# Patient Record
Sex: Male | Born: 1990 | Race: Black or African American | Hispanic: No | Marital: Single | Smoking: Current every day smoker
Health system: Southern US, Community
[De-identification: ages and names within clinical notes are randomized; demographics above are authoritative.]

## PROBLEM LIST (undated history)

## (undated) DIAGNOSIS — I10 Essential (primary) hypertension: Secondary | ICD-10-CM

## (undated) DIAGNOSIS — A539 Syphilis, unspecified: Secondary | ICD-10-CM

## (undated) DIAGNOSIS — B009 Herpesviral infection, unspecified: Secondary | ICD-10-CM

## (undated) DIAGNOSIS — A749 Chlamydial infection, unspecified: Secondary | ICD-10-CM

## (undated) HISTORY — DX: Essential (primary) hypertension: I10

## (undated) HISTORY — DX: Herpesviral infection, unspecified: B00.9

## (undated) HISTORY — DX: Syphilis, unspecified: A53.9

## (undated) HISTORY — DX: Chlamydial infection, unspecified: A74.9

---

## 2014-10-13 ENCOUNTER — Encounter (HOSPITAL_COMMUNITY): Payer: Self-pay | Admitting: Emergency Medicine

## 2014-10-13 ENCOUNTER — Emergency Department (HOSPITAL_COMMUNITY)
Admission: EM | Admit: 2014-10-13 | Discharge: 2014-10-13 | Disposition: A | Discharge status: Home / Self Care | Payer: Medicaid Other | Attending: Emergency Medicine | Admitting: Emergency Medicine

## 2014-10-13 DIAGNOSIS — T23102A Burn of first degree of left hand, unspecified site, initial encounter: Secondary | ICD-10-CM

## 2014-10-13 DIAGNOSIS — T23202A Burn of second degree of left hand, unspecified site, initial encounter: Secondary | ICD-10-CM | POA: Insufficient documentation

## 2014-10-13 DIAGNOSIS — Y9289 Other specified places as the place of occurrence of the external cause: Secondary | ICD-10-CM | POA: Insufficient documentation

## 2014-10-13 DIAGNOSIS — Z720 Tobacco use: Secondary | ICD-10-CM | POA: Diagnosis not present

## 2014-10-13 DIAGNOSIS — T23002A Burn of unspecified degree of left hand, unspecified site, initial encounter: Secondary | ICD-10-CM | POA: Diagnosis present

## 2014-10-13 DIAGNOSIS — Y93G9 Activity, other involving cooking and grilling: Secondary | ICD-10-CM | POA: Insufficient documentation

## 2014-10-13 DIAGNOSIS — X102XXA Contact with fats and cooking oils, initial encounter: Secondary | ICD-10-CM | POA: Diagnosis not present

## 2014-10-13 MED ORDER — HYDROCODONE-ACETAMINOPHEN 5-325 MG PO TABS: 1.0000 | ORAL_TABLET | ORAL | Status: DC | PRN

## 2014-10-13 MED ORDER — HYDROCODONE-ACETAMINOPHEN 5-325 MG PO TABS
1.0000 | ORAL_TABLET | Freq: Once | ORAL | Status: AC
  Administered 2014-10-13: 1 via ORAL
  Filled 2014-10-13: qty 1

## 2014-10-13 MED ORDER — SILVER SULFADIAZINE 1 % EX CREA
TOPICAL_CREAM | Freq: Once | CUTANEOUS | Status: AC
  Administered 2014-10-13: 1 via TOPICAL
  Filled 2014-10-13: qty 50
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
## 2014-10-13 NOTE — ED Notes (Signed)
Pt alert & oriented x4, stable gait. Patient given discharge instructions, paperwork & prescription(s). Patient  instructed to stop at the registration desk to finish any additional paperwork. Patient verbalized understanding. Pt left department w/ no further questions. 

## 2014-10-13 NOTE — Discharge Instructions (Signed)
Do not drive while taking the narcotic as it will make you sleepy. Return tomorrow for recheck and dressing change
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
## 2014-10-14 ENCOUNTER — Emergency Department (HOSPITAL_COMMUNITY)
Admission: EM | Admit: 2014-10-14 | Discharge: 2014-10-14 | Disposition: A | Discharge status: Home / Self Care | Payer: Medicaid Other | Attending: Emergency Medicine | Admitting: Emergency Medicine

## 2014-10-14 ENCOUNTER — Encounter (HOSPITAL_COMMUNITY): Payer: Self-pay | Admitting: Emergency Medicine

## 2014-10-14 DIAGNOSIS — Z720 Tobacco use: Secondary | ICD-10-CM | POA: Diagnosis not present

## 2014-10-14 DIAGNOSIS — Z5189 Encounter for other specified aftercare: Secondary | ICD-10-CM

## 2014-10-14 DIAGNOSIS — Z4800 Encounter for change or removal of nonsurgical wound dressing: Secondary | ICD-10-CM | POA: Insufficient documentation

## 2014-10-14 MED ORDER — BACITRACIN ZINC 500 UNIT/GM EX OINT
TOPICAL_OINTMENT | CUTANEOUS | Status: AC
  Administered 2014-10-14: 1 via TOPICAL
  Filled 2014-10-14: qty 0.9
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
## 2014-10-14 NOTE — Discharge Instructions (Signed)
Burn Care Your skin is a natural barrier to infection. It is the largest organ of your body. Burns damage this natural protection. To help prevent infection, it is very important to follow your caregiver's instructions in the care of your burn. Burns are classified as:  First degree. There is only redness of the skin (erythema). No scarring is expected.  Second degree. There is blistering of the skin. Scarring may occur with deeper burns.  Third degree. All layers of the skin are injured, and scarring is expected. HOME CARE INSTRUCTIONS   Wash your hands well before changing your bandage.  Change your bandage as often as directed by your caregiver.  Remove the old bandage. If the bandage sticks, you may soak it off with cool, clean water.  Cleanse the burn thoroughly but gently with mild soap and water.  Pat the area dry with a clean, dry cloth.  Apply a thin layer of antibacterial cream to the burn.  Apply a clean bandage as instructed by your caregiver.  Keep the bandage as clean and dry as possible.  Elevate the affected area for the first 24 hours, then as instructed by your caregiver.  Only take over-the-counter or prescription medicines for pain, discomfort, or fever as directed by your caregiver. SEEK IMMEDIATE MEDICAL CARE IF:   You develop excessive pain.  You develop redness, tenderness, swelling, or red streaks near the burn.  The burned area develops yellowish-white fluid (pus) or a bad smell.  You have a fever. MAKE SURE YOU:   Understand these instructions.  Will watch your condition.  Will get help right away if you are not doing well or get worse. Document Released: 12/12/2005 Document Revised: 03/05/2012 Document Reviewed: 05/04/2011 ExitCare Patient Information 2015 [employer]. This information is not intended to replace advice given to you by your health care provider. Make sure you discuss any questions you have with your health care  provider.  
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
## 2014-10-14 NOTE — ED Provider Notes (Signed)
Medical screening examination/treatment/procedure(s) were performed by non-physician practitioner and as supervising physician I was immediately available for consultation/collaboration.   EKG Interpretation None       Juliet RudeNathan R. Rubin PayorPickering, MD 10/14/14 [phone]

## 2014-10-16 NOTE — ED Provider Notes (Signed)
Medical screening examination/treatment/procedure(s) were performed by non-physician practitioner and as supervising physician I was immediately available for consultation/collaboration.     Brynnan Rodenbaugh, MD 10/16/14 0708 

## 2014-11-04 ENCOUNTER — Encounter (HOSPITAL_COMMUNITY): Payer: Self-pay | Admitting: Emergency Medicine

## 2014-11-04 ENCOUNTER — Emergency Department (HOSPITAL_COMMUNITY)
Admission: EM | Admit: 2014-11-04 | Discharge: 2014-11-04 | Disposition: A | Discharge status: Home / Self Care | Payer: Medicaid Other | Attending: Emergency Medicine | Admitting: Emergency Medicine

## 2014-11-04 DIAGNOSIS — Z720 Tobacco use: Secondary | ICD-10-CM | POA: Insufficient documentation

## 2014-11-04 DIAGNOSIS — R21 Rash and other nonspecific skin eruption: Secondary | ICD-10-CM | POA: Insufficient documentation

## 2014-11-04 MED ORDER — PREDNISONE 20 MG PO TABS: 40.0000 mg | ORAL_TABLET | Freq: Every day | ORAL | Status: DC

## 2014-11-04 MED ORDER — PREDNISONE 20 MG PO TABS
40.0000 mg | ORAL_TABLET | Freq: Once | ORAL | Status: AC
  Administered 2014-11-04: 40 mg via ORAL
  Filled 2014-11-04: qty 2
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
## 2014-11-04 NOTE — Discharge Instructions (Signed)
Benadryl every 6 hours as needed Prednisone once daily for 5 days  Please call your doctor for a followup appointment within 24-48 hours. When you talk to your doctor please let them know that you were seen in the emergency department and have them acquire all of your records so that they can discuss the findings with you and formulate a treatment plan to fully care for your new and ongoing problems.  You have been diagnosed with an allergic reaction. Usually allergic reactions like this are caused by exposures to something that you either ate or touched or smelled.  It may be related to a number of different exposures including a new perfume, topical creams, soaps, detergents, linens, clothing, medications. Occasionally we do not find an answer for why there is an allergic reaction. These are treated the same way including Benadryl as needed for itching and rash. (This can be used up to 50 mg every 6 hours as needed).  Pepcid 20 mg every night and prednisone once a day for 5 days. Please do not take the Benadryl and drive or take care of children or other imported duties as the Benadryl can make you sleepy.    If you should develop severe or worsening symptoms including difficulty breathing, difficulty swallowing, wheezing or increased coughing or a rash that developed on the inside of your mouth or a worsening rash on your skin, return to the hospital immediately for a recheck. Please call your Dr. in the morning for a recheck in 2 days if you are still having symptoms. If you do not have a Dr. see the list below.  If we have identified the source of your allergic reaction, please avoid this at all costs. This means stopping the medication if it is a new medication or a voiding topical exposures such as creams lotions body soaps or deodorants if this is the source.  Allergic Reaction, Mild to Moderate Allergies may happen from anything your body is sensitive to. This may be food, medications, pollens,  chemicals, and nearly anything around you in everyday life that produces allergens. An allergen is anything that causes an allergy producing substance. Allergens cause your body to release allergic antibodies. Through a chain of events, they cause a release of histamine into the blood stream. Histamines are meant to protect you, but they also cause your discomfort. This is why antihistamines are often used for allergies. Heredity is often a factor in causing allergic reactions. This means you may have some of the same allergies as your parents. Allergies happen in all age groups. You may have some idea of what caused your reaction. There are many allergens around us. It may be difficult to know what caused your reaction. If this is a first time event, it may never happen again. Allergies cannot be cured but can be controlled with medications. SYMPTOMS  You may get some or all of the following problems from allergies.  Swelling and itching in and around the mouth.   Tearing, itchy eyes.   Nasal congestion and runny nose.   Sneezing and coughing.   An itchy red rash or hives.   Vomiting or diarrhea.   Difficulty breathing.  Seasonal allergies occur in all age groups. They are seasonal because they usually occur during the same season every year. They may be a reaction to molds, grass pollens, or tree pollens. Other causes of allergies are house dust mite allergens, pet dander and mold spores. These are just a common few of  the thousands of allergens around us. All of the symptoms listed above happen when you come in contact with pollens and other allergens. Seasonal allergies are usually not life threatening. They are generally more of a nuisance that can often be handled using medications. Hay fever is a combination of all or some of the above listed allergy problems. It may often be treated with simple over-the-counter medications such as diphenhydramine. Take medication as directed. Check with  your caregiver or package insert for child dosages. TREATMENT AND HOME CARE INSTRUCTIONS If hives or rash are present:  Take medications as directed.   You may use an over-the-counter antihistamine (diphenhydramine) for hives and itching as needed. Do not drive or drink alcohol until medications used to treat the reaction have worn off. Antihistamines tend to make people sleepy.   Apply cold cloths (compresses) to the skin or take baths in cool water. This will help itching. Avoid hot baths or showers. Heat will make a rash and itching worse.   If your allergies persist and become more severe, and over the counter medications are not effective, there are many new medications your caretaker can prescribe. Immunotherapy or desensitizing injections can be used if all else fails. Follow up with your caregiver if problems continue.  SEEK MEDICAL CARE IF:   Your allergies are becoming progressively more troublesome.   You suspect a food allergy. Symptoms generally happen within 30 minutes of eating a food.   Your symptoms have not gone away within 2 days or are getting worse.   You develop new symptoms.   You want to retest yourself or your child with a food or drink you think causes an allergic reaction. Never test yourself or your child of a suspected allergy without being under the watchful eye of your caregivers. A second exposure to an allergen may be life-threatening.  SEEK IMMEDIATE MEDICAL CARE IF:  You develop difficulty breathing or wheezing, or have a tight feeling in your chest or throat.   You develop a swollen mouth, hives, swelling, or itching all over your body.  A severe reaction with any of the above problems should be considered life-threatening. If you suddenly develop difficulty breathing call for local emergency medical help. THIS IS AN EMERGENCY. MAKE SURE YOU:   Understand these instructions.   Will watch your condition.   Will get help right away if you are not  doing well or get worse.  Document Released: 10/09/2007 Document Revised: 12/01/2011 Document Reviewed: 10/09/2007 [facility] Patient Information 2012 [place], [place].

## 2016-01-19 ENCOUNTER — Ambulatory Visit: Payer: Self-pay | Admitting: Family Medicine

## 2016-01-28 ENCOUNTER — Ambulatory Visit: Payer: Self-pay | Admitting: Family Medicine

## 2016-01-29 ENCOUNTER — Encounter: Payer: Self-pay | Admitting: Family Medicine

## 2016-02-02 ENCOUNTER — Ambulatory Visit: Payer: Self-pay | Admitting: Family Medicine

## 2016-02-04 ENCOUNTER — Ambulatory Visit: Payer: Self-pay | Admitting: Family Medicine

## 2016-02-09 ENCOUNTER — Encounter: Payer: Self-pay | Admitting: Family Medicine

## 2016-02-09 ENCOUNTER — Ambulatory Visit (INDEPENDENT_AMBULATORY_CARE_PROVIDER_SITE_OTHER): Payer: Medicaid Other | Admitting: Family Medicine

## 2016-02-09 VITALS — BP 136/83 | HR 75 | Temp 97.0°F | Ht 70.0 in | Wt 260.8 lb

## 2016-02-09 DIAGNOSIS — M545 Low back pain, unspecified: Secondary | ICD-10-CM

## 2016-02-09 DIAGNOSIS — Z7251 High risk heterosexual behavior: Secondary | ICD-10-CM | POA: Diagnosis not present

## 2016-02-09 DIAGNOSIS — E669 Obesity, unspecified: Secondary | ICD-10-CM

## 2016-02-09 DIAGNOSIS — Z23 Encounter for immunization: Secondary | ICD-10-CM

## 2016-02-09 NOTE — Patient Instructions (Signed)
Great to meet you!  Come back in 1 year unless you need [place] sooner, you are always welcome to come when you need to, just call for an appointment  We will call with labs within 1 week
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
## 2016-02-11 LAB — LIPID PANEL
Chol/HDL Ratio: 4 ratio units (ref 0.0–5.0)
Cholesterol, Total: 173 mg/dL (ref 100–199)
HDL: 43 mg/dL (ref >39)
LDL Calculated: 100 mg/dL — ABNORMAL HIGH (ref 0–99)
Triglycerides: 148 mg/dL (ref 0–149)
VLDL Cholesterol Cal: 30 mg/dL (ref 5–40)

## 2016-02-11 LAB — CMP14+EGFR
ALT: 37 IU/L (ref 0–44)
AST: 23 IU/L (ref 0–40)
Albumin/Globulin Ratio: 1.6 (ref 1.1–2.5)
Albumin: 4.6 g/dL (ref 3.5–5.5)
Alkaline Phosphatase: 87 IU/L (ref 39–117)
BILIRUBIN TOTAL: 0.3 mg/dL (ref 0.0–1.2)
BUN/Creatinine Ratio: 12 (ref 8–19)
BUN: 9 mg/dL (ref 6–20)
CALCIUM: 9.9 mg/dL (ref 8.7–10.2)
CHLORIDE: 100 mmol/L (ref 96–106)
CO2: 27 mmol/L (ref 18–29)
Creatinine, Ser: 0.78 mg/dL (ref 0.76–1.27)
GFR calc non Af Amer: 127 mL/min/{1.73_m2} (ref >59)
GFR, EST AFRICAN AMERICAN: 147 mL/min/{1.73_m2} (ref >59)
Globulin, Total: 2.9 g/dL (ref 1.5–4.5)
Glucose: 89 mg/dL (ref 65–99)
Potassium: 5.1 mmol/L (ref 3.5–5.2)
Sodium: 141 mmol/L (ref 134–144)
TOTAL PROTEIN: 7.5 g/dL (ref 6.0–8.5)

## 2016-02-11 LAB — GC/CHLAMYDIA PROBE AMP
Chlamydia trachomatis, NAA: NEGATIVE
Neisseria gonorrhoeae by PCR: NEGATIVE

## 2016-02-11 LAB — RPR: RPR: REACTIVE — AB

## 2016-02-11 LAB — RPR, QUANT+TP ABS (REFLEX): T Pallidum Abs: POSITIVE — AB

## 2016-02-11 LAB — HIV ANTIBODY (ROUTINE TESTING W REFLEX): HIV SCREEN 4TH GENERATION: NONREACTIVE

## 2016-02-12 ENCOUNTER — Telehealth: Payer: Self-pay | Admitting: Family Medicine

## 2016-02-12 NOTE — Telephone Encounter (Signed)
Called and discussed  I need his old RPR ratio to confirm adequate treatment and rule out re-infection. He will go to the health department and ask for records to be sent.   Murtis Sink, MD [facility] 02/12/2016, 2:49 PM

## 2016-02-12 NOTE — Telephone Encounter (Signed)
-----   Message from Elenora Gamma, MD sent at 02/12/2016  1:45 PM EST ----- Will you call about his labs? His cholesterol, kidney function, and liver function look good His HIV is negative His syphilis test is positive but should be since he had it and was treated, The ratio 1:4 is probably consitent with a treated infection, not a new infection but we will need health department records to be sure. Can we sk him to sign a ROI or go by the HD to get it sent? Thanks! Sam

## 2016-02-16 ENCOUNTER — Telehealth: Payer: Self-pay | Admitting: Family Medicine

## 2016-02-17 NOTE — Telephone Encounter (Signed)
Called pt to let him know we have not received records. He said he signed records release at the health dept a few days ago. I told him we would call when his records come in/lc

## 2016-02-29 ENCOUNTER — Telehealth: Payer: Self-pay | Admitting: Family Medicine

## 2016-02-29 NOTE — Telephone Encounter (Signed)
Got medical records for patient

## 2016-03-18 ENCOUNTER — Telehealth: Payer: Self-pay | Admitting: Family Medicine

## 2016-03-23 NOTE — Telephone Encounter (Signed)
Called patient to let him know that we haven't [place] [facility]. Release was sent 03-09-16.

## 2016-03-29 ENCOUNTER — Ambulatory Visit: Payer: Medicaid Other | Admitting: Family Medicine

## 2016-03-29 ENCOUNTER — Ambulatory Visit (INDEPENDENT_AMBULATORY_CARE_PROVIDER_SITE_OTHER): Payer: Medicaid Other | Admitting: Family Medicine

## 2016-03-29 ENCOUNTER — Encounter: Payer: Self-pay | Admitting: Family Medicine

## 2016-03-29 VITALS — BP 119/74 | HR 81 | Temp 97.6°F | Ht 70.0 in | Wt 257.4 lb

## 2016-03-29 DIAGNOSIS — R590 Localized enlarged lymph nodes: Secondary | ICD-10-CM | POA: Diagnosis not present

## 2016-03-29 DIAGNOSIS — Z8619 Personal history of other infectious and parasitic diseases: Secondary | ICD-10-CM | POA: Diagnosis not present

## 2016-03-29 NOTE — Patient Instructions (Signed)
Great to see you!  I think your bump is a lymph node and it should go away within a few weeks. If you develop any redness, warmth, or pain at the site or of your ear please get evaluated again.
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
## 2016-07-18 ENCOUNTER — Encounter: Payer: Self-pay | Admitting: Family Medicine

## 2016-08-05 ENCOUNTER — Encounter: Payer: Self-pay | Admitting: Family Medicine

## 2016-08-05 ENCOUNTER — Ambulatory Visit (INDEPENDENT_AMBULATORY_CARE_PROVIDER_SITE_OTHER): Payer: Medicaid Other | Admitting: Family Medicine

## 2016-08-05 VITALS — BP 124/81 | HR 64 | Temp 97.5°F | Ht 70.0 in | Wt 256.8 lb

## 2016-08-05 DIAGNOSIS — R229 Localized swelling, mass and lump, unspecified: Secondary | ICD-10-CM

## 2016-08-05 DIAGNOSIS — Z7251 High risk heterosexual behavior: Secondary | ICD-10-CM | POA: Diagnosis not present

## 2016-08-05 DIAGNOSIS — Z8619 Personal history of other infectious and parasitic diseases: Secondary | ICD-10-CM

## 2016-08-05 DIAGNOSIS — Z720 Tobacco use: Secondary | ICD-10-CM | POA: Diagnosis not present

## 2016-08-05 NOTE — Progress Notes (Signed)
   HPI  Patient presents today here with concern for STDs and to discuss a skin nodule.  Patient has had 804 male sexual partners in the last 6 months., he denies any direct concerns of STIs. He has no pelvic discomfort or penile discharge. No fevers, chills, sweats, and he feels well.   He uses condoms with anal sex but not with oral sex.   R eyelid skin nodule which developed after removing an old eyebrpow piercing.  No pain, changing, or discharge. Would like referral to discuss if it can be removed.   PMH: Smoking status noted ROS: Per HPI  Objective: BP 124/81   Pulse 64   Temp 97.5 F (36.4 C) (Oral)   Ht 5\' 10"  (1.778 m)   Wt 256 lb 12.8 oz (116.5 kg)   BMI 36.85 kg/m  Gen: NAD, alert, cooperative with exam HEENT: NCAT, R eye brow with approx 5 mm circular skin nodule, non tender to palp and mobile CV: RRR, good S1/S2, no murmur Resp: CTABL, no wheezes, non-labored Ext: No edema, warm Neuro: Alert and oriented, No gross deficits  Declines GU exam  Assessment and plan:  # High risk sexual behavior Also with history of syphilis. HIV, RPR, and urine Gonorrhea and chlamydia checked today. Discussed always using condoms.  Last RPR was 1:4, down from 1:64 at the HD   # Skin nodule Refer to derm, Likely scar tissue/fibrosis.   # tobacco abuse Recommended quitting smoking completely, he is not ready to quit     Orders Placed This Encounter  Procedures  . GC/Chlamydia Probe Amp  . RPR  . HIV antibody  . Ambulatory referral to Dermatology    Referral Priority:   Routine    Referral Type:   Consultation    Referral Reason:   Specialty Services Required    Requested Specialty:   Dermatology    Number of Visits Requested:   1     Murtis SinkSam Reeshemah Nazaryan, MD [facility] 08/05/2016, 1:12 PM

## 2016-08-05 NOTE — Patient Instructions (Signed)
Great to meet you!  Lets see you again in 1 year unless you need us sooner.   We will send your labs on mychart or call within 1 week  You will hear from us about a dermatology referral.

## 2016-08-08 LAB — GC/CHLAMYDIA PROBE AMP
CHLAMYDIA, DNA PROBE: NEGATIVE
NEISSERIA GONORRHOEAE BY PCR: NEGATIVE

## 2016-08-08 LAB — RPR: RPR: REACTIVE — AB

## 2016-08-08 LAB — RPR, QUANT+TP ABS (REFLEX)
Rapid Plasma Reagin, Quant: 1:4 {titer} — ABNORMAL HIGH
T Pallidum Abs: POSITIVE — AB

## 2016-08-08 LAB — HIV ANTIBODY (ROUTINE TESTING W REFLEX): HIV Screen 4th Generation wRfx: NONREACTIVE

## 2016-08-09 ENCOUNTER — Telehealth: Payer: Self-pay | Admitting: Family Medicine

## 2016-08-09 NOTE — Telephone Encounter (Signed)
Called and eft simple voicemail that stated all labs were good and that I would release the results in Mychart.   He understands the RPRP ratio from our last coversation. 1:4 indicates adequate treatment for syphilis.   Murtis SinkSam Ella Golomb, MD [facility] 08/09/2016, 6:46 PM

## 2016-11-21 ENCOUNTER — Ambulatory Visit: Payer: Self-pay | Admitting: Family Medicine

## 2016-11-24 ENCOUNTER — Ambulatory Visit: Payer: Medicaid Other | Admitting: Family Medicine

## 2017-01-16 ENCOUNTER — Ambulatory Visit: Payer: Self-pay | Admitting: Family Medicine

## 2017-01-27 ENCOUNTER — Ambulatory Visit (INDEPENDENT_AMBULATORY_CARE_PROVIDER_SITE_OTHER): Payer: Medicaid Other | Admitting: Family Medicine

## 2017-01-27 ENCOUNTER — Encounter: Payer: Self-pay | Admitting: Family Medicine

## 2017-01-27 VITALS — BP 144/80 | HR 98 | Temp 97.2°F | Ht 70.0 in | Wt 268.6 lb

## 2017-01-27 DIAGNOSIS — Z7251 High risk heterosexual behavior: Secondary | ICD-10-CM | POA: Diagnosis not present

## 2017-01-27 DIAGNOSIS — Z720 Tobacco use: Secondary | ICD-10-CM | POA: Diagnosis not present

## 2017-01-27 DIAGNOSIS — E6609 Other obesity due to excess calories: Secondary | ICD-10-CM

## 2017-01-27 DIAGNOSIS — Z6838 Body mass index (BMI) 38.0-38.9, adult: Secondary | ICD-10-CM | POA: Diagnosis not present

## 2017-01-27 NOTE — Progress Notes (Signed)
   HPI  Patient presents today requesting STI checks, and for follow-up of chronic medical conditions.  Tobacco abuse Patient smoking, no interest in quitting.  Obesity He has increased his exercise, however he's been gaining weight. He has cut out some high carbohydrate foods. He's planning to join the [facility] next month.  Patient has one new partner, mouth, uses condoms sometimes. He has a history of syphilis.  PMH: Smoking status noted ROS: Per HPI  Objective: BP (!) 144/80   Pulse 98   Temp 97.2 F (36.2 C) (Oral)   Ht 5\' 10"  (1.778 m)   Wt 268 lb 9.6 oz (121.8 kg)   BMI 38.54 kg/m  Gen: NAD, alert, cooperative with exam HEENT: NCAT CV: RRR, good S1/S2, no murmur Resp: CTABL, no wheezes, non-labored Ext: No edema, warm Neuro: Alert and oriented, No gross deficits  Assessment and plan:  # High-Risk sexual behavior HIV, RPR, GC C collected Previously patient had history of syphilis so will need to compare RPR  # Smoking, tobacco abuse Recommended cessation, he's considering but is not ready  # Obesity Worsening Discussed therapeutic lifestyle changes    Orders Placed This Encounter  Procedures  . GC/Chlamydia Probe Amp    Order Specific Question:   Source    Answer:   Urine  . HIV antibody (with reflex)  . RPR     Murtis SinkSam Bradshaw, MD [facility] 01/27/2017, 3:17 PM

## 2017-01-27 NOTE — Patient Instructions (Signed)
Great to see you!  Come back in 6 months unless you need us sooner.   We will call with labs within 1 week 

## 2017-01-30 LAB — RPR, QUANT+TP ABS (REFLEX)
Rapid Plasma Reagin, Quant: 1:2 {titer} — ABNORMAL HIGH
T Pallidum Abs: POSITIVE — AB

## 2017-01-30 LAB — GC/CHLAMYDIA PROBE AMP
Chlamydia trachomatis, NAA: NEGATIVE
NEISSERIA GONORRHOEAE BY PCR: NEGATIVE

## 2017-01-30 LAB — HIV ANTIBODY (ROUTINE TESTING W REFLEX): HIV SCREEN 4TH GENERATION: NONREACTIVE

## 2017-01-30 LAB — RPR: RPR Ser Ql: REACTIVE — AB

## 2017-01-31 ENCOUNTER — Telehealth: Payer: Self-pay | Admitting: Family Medicine

## 2017-01-31 NOTE — Telephone Encounter (Signed)
Called and discussed normal labs.   RPR improving.   Murtis SinkSam Elgin Carn, MD [facility] 01/31/2017, 5:35 PM

## 2017-02-28 ENCOUNTER — Telehealth: Payer: Self-pay | Admitting: Family Medicine

## 2017-02-28 NOTE — Telephone Encounter (Signed)
Talked to billing 

## 2017-10-20 ENCOUNTER — Ambulatory Visit (INDEPENDENT_AMBULATORY_CARE_PROVIDER_SITE_OTHER): Payer: Medicaid Other | Admitting: Family Medicine

## 2017-10-20 ENCOUNTER — Ambulatory Visit (INDEPENDENT_AMBULATORY_CARE_PROVIDER_SITE_OTHER): Payer: Medicaid Other

## 2017-10-20 ENCOUNTER — Encounter: Payer: Self-pay | Admitting: Family Medicine

## 2017-10-20 DIAGNOSIS — M542 Cervicalgia: Secondary | ICD-10-CM

## 2017-10-20 DIAGNOSIS — R51 Headache: Secondary | ICD-10-CM

## 2017-10-20 DIAGNOSIS — R0781 Pleurodynia: Secondary | ICD-10-CM | POA: Diagnosis not present

## 2017-10-20 DIAGNOSIS — R519 Headache, unspecified: Secondary | ICD-10-CM

## 2017-10-20 MED ORDER — CYCLOBENZAPRINE HCL 10 MG PO TABS
10.0000 mg | ORAL_TABLET | Freq: Three times a day (TID) | ORAL | 0 refills | Status: DC | PRN
Start: 2017-10-20 — End: 2018-07-16

## 2017-10-20 MED ORDER — NAPROXEN 500 MG PO TABS: 500.0000 mg | ORAL_TABLET | Freq: Two times a day (BID) | ORAL | 0 refills | Status: DC

## 2017-10-20 NOTE — Patient Instructions (Signed)
Great to see you!  Come back in the next 2-3 months when you are ready.   Try to reduce your screen time to help with headaches. Get plenty of rest in the next week.   Try naproxen 1 pill twice daily with food for pain, try flexeril at night.
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
## 2018-07-16 ENCOUNTER — Encounter: Payer: Self-pay | Admitting: Family Medicine

## 2018-07-16 ENCOUNTER — Ambulatory Visit: Payer: Medicaid Other | Admitting: Family Medicine

## 2018-07-16 VITALS — BP 126/89 | HR 89 | Temp 98.0°F | Ht 70.0 in | Wt 277.2 lb

## 2018-07-16 DIAGNOSIS — Z8619 Personal history of other infectious and parasitic diseases: Secondary | ICD-10-CM

## 2018-07-16 DIAGNOSIS — E6609 Other obesity due to excess calories: Secondary | ICD-10-CM | POA: Diagnosis not present

## 2018-07-16 DIAGNOSIS — Z6838 Body mass index (BMI) 38.0-38.9, adult: Secondary | ICD-10-CM | POA: Diagnosis not present

## 2018-07-16 DIAGNOSIS — G43909 Migraine, unspecified, not intractable, without status migrainosus: Secondary | ICD-10-CM | POA: Diagnosis not present

## 2018-07-16 DIAGNOSIS — Z7251 High risk heterosexual behavior: Secondary | ICD-10-CM

## 2018-07-16 DIAGNOSIS — Z7689 Persons encountering health services in other specified circumstances: Secondary | ICD-10-CM | POA: Diagnosis not present
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
## 2018-07-16 NOTE — Patient Instructions (Addendum)
Great to see you!  Come back in 6 months unless you need us sooner.    

## 2018-07-17 LAB — GC/CHLAMYDIA PROBE AMP
CHLAMYDIA, DNA PROBE: NEGATIVE
Neisseria gonorrhoeae by PCR: NEGATIVE

## 2018-07-18 ENCOUNTER — Telehealth: Payer: Self-pay | Admitting: Family Medicine

## 2018-07-18 LAB — CMP14+EGFR
ALBUMIN: 4.5 g/dL (ref 3.5–5.5)
ALK PHOS: 93 IU/L (ref 39–117)
ALT: 52 IU/L — ABNORMAL HIGH (ref 0–44)
AST: 24 IU/L (ref 0–40)
Albumin/Globulin Ratio: 1.5 (ref 1.2–2.2)
BUN / CREAT RATIO: 8 — AB (ref 9–20)
BUN: 9 mg/dL (ref 6–20)
Bilirubin Total: 0.3 mg/dL (ref 0.0–1.2)
CO2: 22 mmol/L (ref 20–29)
Calcium: 9.7 mg/dL (ref 8.7–10.2)
Chloride: 100 mmol/L (ref 96–106)
Creatinine, Ser: 1.09 mg/dL (ref 0.76–1.27)
GFR calc non Af Amer: 94 mL/min/{1.73_m2} (ref >59)
GFR, EST AFRICAN AMERICAN: 108 mL/min/{1.73_m2} (ref >59)
GLOBULIN, TOTAL: 3 g/dL (ref 1.5–4.5)
GLUCOSE: 109 mg/dL — AB (ref 65–99)
Potassium: 4.1 mmol/L (ref 3.5–5.2)
SODIUM: 139 mmol/L (ref 134–144)
Total Protein: 7.5 g/dL (ref 6.0–8.5)

## 2018-07-18 LAB — LIPID PANEL
Chol/HDL Ratio: 6.2 ratio — ABNORMAL HIGH (ref 0.0–5.0)
Cholesterol, Total: 205 mg/dL — ABNORMAL HIGH (ref 100–199)
HDL: 33 mg/dL — AB (ref >39)
LDL Calculated: 125 mg/dL — ABNORMAL HIGH (ref 0–99)
Triglycerides: 234 mg/dL — ABNORMAL HIGH (ref 0–149)
VLDL Cholesterol Cal: 47 mg/dL — ABNORMAL HIGH (ref 5–40)

## 2018-07-18 LAB — RPR, QUANT+TP ABS (REFLEX)
Rapid Plasma Reagin, Quant: 1:2 {titer} — ABNORMAL HIGH
T Pallidum Abs: POSITIVE — AB

## 2018-07-18 LAB — HEPATITIS C ANTIBODY: Hep C Virus Ab: <0.1 s/co ratio (ref 0.0–0.9)

## 2018-07-18 LAB — RPR: RPR: REACTIVE — AB

## 2018-07-18 LAB — HIV ANTIBODY (ROUTINE TESTING W REFLEX): HIV Screen 4th Generation wRfx: NONREACTIVE

## 2018-07-18 NOTE — Telephone Encounter (Signed)
Patient is aware that we waiting on the lab results and will call him when we receive them.

## 2018-07-19 NOTE — Telephone Encounter (Signed)
Called and discussed his testing.  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  On 12/11/2014 he had a positive titer at 1-64. This was treated per usual protocol at the HD  Patient now maintains titer at 1-2.  Discussed likely fatty liver, slightly elevated transaminase.  Discussed improving diet and exercise.  Labs were nonfasting.  Murtis SinkSam Sibbie Flammia, MD [facility] 07/19/2018, 4:57 PM

## 2018-08-20 DIAGNOSIS — S80811A Abrasion, right lower leg, initial encounter: Secondary | ICD-10-CM | POA: Diagnosis not present

## 2018-08-29 DIAGNOSIS — H5213 Myopia, bilateral: Secondary | ICD-10-CM | POA: Diagnosis not present

## 2018-08-29 DIAGNOSIS — H04811 Granuloma of right lacrimal passage: Secondary | ICD-10-CM | POA: Diagnosis not present

## 2018-08-31 DIAGNOSIS — H5213 Myopia, bilateral: Secondary | ICD-10-CM | POA: Diagnosis not present

## 2018-09-11 DIAGNOSIS — H5213 Myopia, bilateral: Secondary | ICD-10-CM | POA: Diagnosis not present

## 2018-09-28 DIAGNOSIS — S91201A Unspecified open wound of right great toe with damage to nail, initial encounter: Secondary | ICD-10-CM | POA: Diagnosis not present

## 2018-09-28 DIAGNOSIS — M79674 Pain in right toe(s): Secondary | ICD-10-CM | POA: Diagnosis not present

## 2018-09-28 DIAGNOSIS — S91101A Unspecified open wound of right great toe without damage to nail, initial encounter: Secondary | ICD-10-CM | POA: Diagnosis not present

## 2018-09-28 DIAGNOSIS — S99921A Unspecified injury of right foot, initial encounter: Secondary | ICD-10-CM | POA: Diagnosis not present

## 2018-12-21 DIAGNOSIS — Z113 Encounter for screening for infections with a predominantly sexual mode of transmission: Secondary | ICD-10-CM | POA: Diagnosis not present

## 2019-01-07 ENCOUNTER — Ambulatory Visit: Payer: Medicaid Other | Admitting: Family Medicine

## 2019-01-07 ENCOUNTER — Encounter: Payer: Self-pay | Admitting: Family Medicine

## 2019-01-07 VITALS — BP 136/89 | HR 88 | Temp 97.7°F | Ht 70.0 in | Wt 286.0 lb

## 2019-01-07 DIAGNOSIS — Z8619 Personal history of other infectious and parasitic diseases: Secondary | ICD-10-CM

## 2019-01-07 DIAGNOSIS — L723 Sebaceous cyst: Secondary | ICD-10-CM | POA: Diagnosis not present

## 2019-01-07 DIAGNOSIS — Z7689 Persons encountering health services in other specified circumstances: Secondary | ICD-10-CM | POA: Diagnosis not present

## 2019-01-07 DIAGNOSIS — Z7251 High risk heterosexual behavior: Secondary | ICD-10-CM | POA: Diagnosis not present

## 2019-01-07 DIAGNOSIS — L089 Local infection of the skin and subcutaneous tissue, unspecified: Secondary | ICD-10-CM

## 2019-01-07 MED ORDER — DOXYCYCLINE HYCLATE 100 MG PO TABS: 100.0000 mg | ORAL_TABLET | Freq: Two times a day (BID) | ORAL | 0 refills | Status: DC

## 2019-01-07 NOTE — Progress Notes (Signed)
 Subjective: CC: STD check, right eye abscess PCP: Gottschalk, Ashly M, DO HPI:Gerald [name] is a 28 y.o. male presenting to clinic today for:  1. STD check Patient wishes to have STD test performed.  He notes occasional unprotected intercourse with other males.  Last intercourse was in December.  Last HIV test was in July.  He denies any unplanned weight loss, rashes, sore throat, penile discharge or fevers.  He has a past medical history significant for syphilis, for which he was treated.  He engages in anal intercourse, oral intercourse.  Denies any rectal bleeds, tears or discharge.  2.  Eyelid lesion Patient reports a lesion on the right upper eyelid that is been ongoing for several days.  He reports associated drainage today.  Denies any visual disturbance or facial pain.  He has not done anything to treat this yet.  He has had this lesion in the past and was actually recommended to see the specialist but had not done that because she was unsure if Medicaid would pay for it.   ROS: Per HPI  No Known Allergies Past Medical History:  Diagnosis Date  . Syphilis    treated at HD   No current outpatient medications on file. Social History   Socioeconomic History  . Marital status: Single    Spouse name: Not on file  . Number of children: Not on file  . Years of education: Not on file  . Highest education level: Not on file  Occupational History  . Not on file  Social Needs  . Financial resource strain: Not on file  . Food insecurity:    Worry: Not on file    Inability: Not on file  . Transportation needs:    Medical: Not on file    Non-medical: Not on file  Tobacco Use  . Smoking status: Current Every Day Smoker    Packs/day: 0.50    Types: Cigarettes  . Smokeless tobacco: Current User  Substance and Sexual Activity  . Alcohol use: No  . Drug use: No  . Sexual activity: Yes    Comment: bisexual  Lifestyle  . Physical activity:    Days per week: Not on file     Minutes per session: Not on file  . Stress: Not on file  Relationships  . Social connections:    Talks on phone: Not on file    Gets together: Not on file    Attends religious service: Not on file    Active member of club or organization: Not on file    Attends meetings of clubs or organizations: Not on file    Relationship status: Not on file  . Intimate partner violence:    Fear of current or ex partner: Not on file    Emotionally abused: Not on file    Physically abused: Not on file    Forced sexual activity: Not on file  Other Topics Concern  . Not on file  Social History Narrative  . Not on file   Family History  Problem Relation Age of Onset  . Diabetes Mother   . Hypertension Mother   . Hypertension Paternal Grandmother   . Diabetes Paternal Grandmother     Objective: Office vital signs reviewed. BP 136/89   Pulse 88   Temp 97.7 F (36.5 C) (Oral)   Ht 5' 10" (1.778 m)   Wt 286 lb (129.7 kg)   BMI 41.04 kg/m   Physical Examination:  General: Awake, alert, obese. No   acute distress HEENT: Right upper eyelid with well circumscribed, fluctuant and mildly indurated cyst    Neck: No masses palpated. No lymphadenopathy    Eyes: PERRLA, extraocular membranes intact, sclera white    Throat: moist mucus membranes, no erythema, no oropharyngeal exudate Rectal: no masses or tears noted.  Assessment/ Plan: 28 y.o. male   1. Infected sebaceous cyst Given exudate and associated erythema, I have placed him on doxycycline to take twice daily.  Verbal consent was obtained to perform I&D with a 23-gauge needle during today's visit.  He tolerated procedure well.  I have encouraged him to continue applying warm compresses to the affected area to promote drainage.  Have also placed referral to general surgery to have cyst removed given recurrent infections. - Ambulatory referral to General Surgery  2. High risk sexual behavior, unspecified type I reinforced safe sex  practices.  He had oral pharyngeal, rectal and urinary gonorrhea chlamydia testing done today.  We also obtained an STD screen that test for hepatitis, HSV, syphilis and HIV.  He has known past medical history of syphilis that was treated.  Additionally, I did offer preexposure prophylaxis today.  He is to consider this and we will revisit this subject again in the future.  I think given his high risk sexual practices he would benefit from this medication. - GC/Chlamydia Probe Amp([facility]) - GC NAA, Pharyngeal - Chlamydia/Gonococcus/Trichomonas, NAA - STD Screen (8) - CMP14+EGFR  3. History of syphilis As above   Orders Placed This Encounter  Procedures  . GC/Chlamydia Probe Amp([facility])  . GC NAA, Pharyngeal  . Chlamydia/Gonococcus/Trichomonas, NAA  . STD Screen (8)  . CMP14+EGFR  . Ambulatory referral to General Surgery    Referral Priority:   Routine    Referral Type:   Surgical    Referral Reason:   Specialty Services Required    Requested Specialty:   General Surgery    Number of Visits Requested:   1   Meds ordered this encounter  Medications  . doxycycline (VIBRA-TABS) 100 MG tablet    Sig: Take 1 tablet (100 mg total) by mouth 2 (two) times daily.    Dispense:  20 tablet    Refill:  [place], DO [place] [phone]

## 2019-01-07 NOTE — Patient Instructions (Signed)
Continue using warm compresses on the cyst of your eye.  I put you on an antibiotic to take twice daily with food.  I have also placed a referral to the specialist to have it removed.   Epidermal Cyst  An epidermal cyst is a small, painless lump under your skin. The cyst contains a grayish-white, bad-smelling substance (keratin). Do not try to pop or open an epidermal cyst yourself. What are the causes?  A blocked hair follicle.  A hair that curls and re-enters the skin instead of growing straight out of the skin.  A blocked pore.  Irritated skin.  An injury to the skin.  Certain conditions that are passed along from parent to child (inherited).  Human papillomavirus (HPV).  Long-term sun damage to the skin. What increases the risk?  Having acne.  Being overweight.  Being 40-27 years old. What are the signs or symptoms? These cysts are usually harmless, but they can get infected. Symptoms of infection may include:  Redness.  Inflammation.  Tenderness.  Warmth.  Fever.  A grayish-white, bad-smelling substance drains from the cyst.  Pus drains from the cyst. How is this treated? In many cases, epidermal cysts go away on their own without treatment. If a cyst becomes infected, treatment may include:  Opening and draining the cyst, done by a doctor. After draining, you may need minor surgery to remove the rest of the cyst.  Antibiotic medicine.  Shots of medicines (steroids) that help to reduce inflammation.  Surgery to remove the cyst. Surgery may be done if the cyst: ? Becomes large. ? Bothers you. ? Has a chance of turning into cancer.  Do not try to open a cyst yourself. Follow these instructions at home:  Take over-the-counter and prescription medicines only as told by your doctor.  If you were prescribed an antibiotic medicine, take it it as told by your doctor. Do not stop using the antibiotic even if you start to feel better.  Keep the area  around your cyst clean and dry.  Wear loose, dry clothing.  Avoid touching your cyst.  Check your cyst every day for signs of infection. Check for: ? Redness, swelling, or pain. ? Fluid or blood. ? Warmth. ? Pus or a bad smell.  Keep all follow-up visits as told by your doctor. This is important. How is this prevented?  Wear clean, dry, clothing.  Avoid wearing tight clothing.  Keep your skin clean and dry. Take showers or baths every day. Contact a doctor if:  Your cyst has symptoms of infection.  Your condition does not improve or gets worse.  You have a cyst that looks different from other cysts you have had.  You have a fever. Get help right away if:  Redness spreads from the cyst into the area close by. Summary  An epidermal cyst is a sac made of skin tissue.  If a cyst becomes infected, treatment may include surgery to open and drain the cyst, or to remove it.  Take over-the-counter and prescription medicines only as told by your doctor.  Contact a doctor if your condition is not improving or is getting worse.  Keep all follow-up visits as told by your doctor. This is important. This information is not intended to replace advice given to you by your health care provider. Make sure you discuss any questions you have with your health care provider. Document Released: 01/19/2005 Document Revised: 06/25/2018 Document Reviewed: 10/14/2015 Elsevier Interactive Patient Education  2019 [employer].

## 2019-01-08 LAB — CMP14+EGFR
A/G RATIO: 1.4 (ref 1.2–2.2)
ALT: 51 IU/L — AB (ref 0–44)
AST: 30 IU/L (ref 0–40)
Albumin: 4.4 g/dL (ref 3.5–5.5)
Alkaline Phosphatase: 90 IU/L (ref 39–117)
BUN / CREAT RATIO: 15 (ref 9–20)
BUN: 12 mg/dL (ref 6–20)
Bilirubin Total: <0.2 mg/dL (ref 0.0–1.2)
CHLORIDE: 102 mmol/L (ref 96–106)
CO2: 21 mmol/L (ref 20–29)
Calcium: 10 mg/dL (ref 8.7–10.2)
Creatinine, Ser: 0.82 mg/dL (ref 0.76–1.27)
GFR calc Af Amer: 141 mL/min/{1.73_m2} (ref >59)
GFR calc non Af Amer: 122 mL/min/{1.73_m2} (ref >59)
GLUCOSE: 77 mg/dL (ref 65–99)
Globulin, Total: 3.1 g/dL (ref 1.5–4.5)
POTASSIUM: 4.4 mmol/L (ref 3.5–5.2)
Sodium: 140 mmol/L (ref 134–144)
TOTAL PROTEIN: 7.5 g/dL (ref 6.0–8.5)

## 2019-01-08 LAB — RPR, QUANT. (REFLEX): Rapid Plasma Reagin, Quant: 1:2 {titer} — ABNORMAL HIGH

## 2019-01-08 LAB — STD SCREEN (8)
HEP A IGM: NEGATIVE
HIV SCREEN 4TH GENERATION: NONREACTIVE
HSV 1 Glycoprotein G Ab, IgG: 5.26 index — ABNORMAL HIGH (ref 0.00–0.90)
HSV 2 IgG, Type Spec: <0.91 index (ref 0.00–0.90)
Hep B C IgM: NEGATIVE
Hep C Virus Ab: 0.1 s/co ratio (ref 0.0–0.9)
Hepatitis B Surface Ag: NEGATIVE
RPR Ser Ql: REACTIVE — AB

## 2019-01-09 ENCOUNTER — Other Ambulatory Visit: Payer: Self-pay | Admitting: Family Medicine

## 2019-01-09 ENCOUNTER — Encounter: Payer: Self-pay | Admitting: Family Medicine

## 2019-01-09 ENCOUNTER — Ambulatory Visit (INDEPENDENT_AMBULATORY_CARE_PROVIDER_SITE_OTHER): Payer: Medicaid Other | Admitting: *Deleted

## 2019-01-09 DIAGNOSIS — Z7251 High risk heterosexual behavior: Secondary | ICD-10-CM

## 2019-01-09 LAB — CHLAMYDIA/GONOCOCCUS/TRICHOMONAS, NAA
Chlamydia by NAA: NEGATIVE
GONOCOCCUS BY NAA: NEGATIVE
TRICH VAG BY NAA: NEGATIVE

## 2019-01-09 LAB — GC NAA, PHARYNGEAL: N GONORRHOEA RRNA NPH QL PCR: POSITIVE — AB

## 2019-01-09 LAB — GC/CHLAMYDIA PROBE AMP
Chlamydia trachomatis, NAA: NEGATIVE
Neisseria gonorrhoeae by PCR: POSITIVE — AB

## 2019-01-09 MED ORDER — CEFTRIAXONE SODIUM 250 MG IJ SOLR
250.0000 mg | Freq: Once | INTRAMUSCULAR | Status: AC
  Administered 2019-01-09: 250 mg via INTRAMUSCULAR

## 2019-01-09 MED ORDER — AZITHROMYCIN 250 MG PO TABS: ORAL_TABLET | ORAL | 0 refills | Status: DC

## 2019-01-09 NOTE — Progress Notes (Signed)
Patient tolerated injection well.  He waited 20 minutes after injection in office.  He stated that he had slight itching at injection site.  I viewed the injection site- no rash, redness, or swelling present.  Adivsed patient if he developed any of the above symtpoms to call our office.  Advised if he has any shortness of breath get evaluation at ER.  Patient agreeable.

## 2019-01-09 NOTE — Progress Notes (Signed)
Communicable Disease Report sent to [facility] Department

## 2019-01-14 DIAGNOSIS — L723 Sebaceous cyst: Secondary | ICD-10-CM | POA: Diagnosis not present

## 2019-01-18 ENCOUNTER — Telehealth: Payer: Self-pay | Admitting: Family Medicine

## 2019-01-18 NOTE — Telephone Encounter (Signed)
Patient was seen 1/13- please review and advise.

## 2019-01-18 NOTE — Telephone Encounter (Signed)
Pt advised of MD feedback and voiced understanding. I can see he does have appt with Gen Surgery 1/30 and pt is aware of appt.

## 2019-01-18 NOTE — Telephone Encounter (Signed)
Doxycycline is for antibacterial coverage.  It would likely not be resolved until it is drained or removed by surgery.  He has a referral in place.  Please make sure that he has an appointment secured.  If he feels that symptoms are getting worse or he is developing any worsening symptoms or signs like fevers or difficulty with vision, please seek immediate medical attention.

## 2019-01-24 ENCOUNTER — Ambulatory Visit (INDEPENDENT_AMBULATORY_CARE_PROVIDER_SITE_OTHER): Payer: Medicaid Other | Admitting: General Surgery

## 2019-01-24 ENCOUNTER — Encounter: Payer: Self-pay | Admitting: General Surgery

## 2019-01-24 VITALS — BP 139/87 | HR 91 | Temp 97.7°F | Resp 20 | Wt 283.4 lb

## 2019-01-24 DIAGNOSIS — H02823 Cysts of right eye, unspecified eyelid: Secondary | ICD-10-CM | POA: Diagnosis not present

## 2019-01-24 DIAGNOSIS — Z7689 Persons encountering health services in other specified circumstances: Secondary | ICD-10-CM | POA: Diagnosis not present

## 2019-01-24 NOTE — Progress Notes (Signed)
[facility] History and Physical  Reason for Referral: Epidermoid cyst on right eyelid  Referring Physician:  Raliegh IpGottschalk, Ashly M, DO   Chief Complaint    Epidermal Cyst      Gerald [name] is a 28 y.o. male.  HPI:  Gerald [name] is a 28 yo who is relatively healthy and presented with new pain and swelling over the right eyelid. He said that the area developed suddenly and that he was able to squeeze it and get "cottage cheese" out of the cyst. He was given antibiotics by his PCP.  He has not had any other prior cysts like this or infections. He denies any other cysts in other locations.   He says that the area has not been bothering him since he squeezed it.    Past Medical History:  Diagnosis Date  . Syphilis    treated at HD    History reviewed. No pertinent surgical history.  Family History  Problem Relation Age of Onset  . Diabetes Mother   . Hypertension Mother   . Hypertension Paternal Grandmother   . Diabetes Paternal Grandmother     Social History   Tobacco Use  . Smoking status: Current Every Day Smoker    Packs/day: 0.50    Types: Cigarettes  . Smokeless tobacco: Current User  Substance Use Topics  . Alcohol use: No  . Drug use: No    Medications: I have reviewed the patient's current medications. No home medications Completed Doxycycline on 01/17/2019 Allergies as of 01/24/2019   No Known Allergies     Medication List    as of January 24, 2019 11:59 PM   You have not been prescribed any medications.      ROS:  A comprehensive review of systems was negative except for: Hematologic/lymphatic: positive for swollen lymph nodes with cyst infection Neurological: positive for headaches  Blood pressure 139/87, pulse 91, temperature 97.7 F (36.5 C), temperature source Temporal, resp. rate 20, weight 283 lb 6.4 oz (128.5 kg). Physical Exam Vitals signs reviewed.  Constitutional:      Appearance: Normal appearance.  HENT:   Head: Normocephalic.     Nose: Nose normal.     Mouth/Throat:     Mouth: Mucous membranes are moist.  Eyes:     Extraocular Movements: Extraocular movements intact.     Pupils: Pupils are equal, round, and reactive to light.     Comments: Right eyelid with slightly raised, slightly erythematous area, tender consistent with cyst  Neck:     Musculoskeletal: Normal range of motion.  Cardiovascular:     Rate and Rhythm: Normal rate and regular rhythm.  Pulmonary:     Effort: Pulmonary effort is normal.     Breath sounds: Normal breath sounds.  Abdominal:     General: There is no distension.     Palpations: Abdomen is soft.     Tenderness: There is no abdominal tenderness.  Musculoskeletal: Normal range of motion.  Skin:    General: Skin is warm and dry.  Neurological:     General: No focal deficit present.     Mental Status: He is alert and oriented to person, place, and time.  Psychiatric:        Mood and Affect: Mood normal.        Behavior: Behavior normal.        Thought Content: Thought content normal.        Judgment: Judgment normal.     Results: None  Assessment & Plan:  Gerald [name] is a 28 y.o. male with a right eyelid epidermoid cyst. This has been infected recently. We discussed that these cyst are common and are not worrisome but that if they cause him problems or pain that he can get it removed.  I explained that the cyst will likely recur due to the cyst wall being present even after he expressed the contents.  He would like to get it removed. We discussed the risk and benefits of the procedure including but not limited to bleeding, infection, risk of nerve damage, numbness, and scar from the incision.   -Will need opthalmic preparation for the procedure. -He will at least need sedation if not general anesthesia due to the location and needing to be calm during the procedure  -Will need 24 hr care after and a ride home. He can take [facility] to the hospital but not  home. -He is going to arrange these things and pick a date and let us know.     Gerald [name] 01/25/2019, 12:40 PM

## 2019-01-24 NOTE — Patient Instructions (Signed)
Epidermal Cyst (Sebaceous Cyst)  An epidermal cyst is a small, painless lump under your skin. The cyst contains a grayish-white, bad-smelling substance (keratin). Do not try to pop or open an epidermal cyst yourself. What are the causes?  A blocked hair follicle.  A hair that curls and re-enters the skin instead of growing straight out of the skin.  A blocked pore.  Irritated skin.  An injury to the skin.  Certain conditions that are passed along from parent to child (inherited).  Human papillomavirus (HPV).  Long-term sun damage to the skin. What increases the risk?  Having acne.  Being overweight.  Being 30-40 years old. What are the signs or symptoms? These cysts are usually harmless, but they can get infected. Symptoms of infection may include:  Redness.  Inflammation.  Tenderness.  Warmth.  Fever.  A grayish-white, bad-smelling substance drains from the cyst.  Pus drains from the cyst. How is this treated? In many cases, epidermal cysts go away on their own without treatment. If a cyst becomes infected, treatment may include:  Opening and draining the cyst, done by a doctor. After draining, you may need minor surgery to remove the rest of the cyst.  Antibiotic medicine.  Shots of medicines (steroids) that help to reduce inflammation.  Surgery to remove the cyst. Surgery may be done if the cyst: ? Becomes large. ? Bothers you. ? Has a chance of turning into cancer.  Do not try to open a cyst yourself. Follow these instructions at home:  Take over-the-counter and prescription medicines only as told by your doctor.  If you were prescribed an antibiotic medicine, take it it as told by your doctor. Do not stop using the antibiotic even if you start to feel better.  Keep the area around your cyst clean and dry.  Wear loose, dry clothing.  Avoid touching your cyst.  Check your cyst every day for signs of infection. Check for: ? Redness, swelling,  or pain. ? Fluid or blood. ? Warmth. ? Pus or a bad smell.  Keep all follow-up visits as told by your doctor. This is important. How is this prevented?  Wear clean, dry, clothing.  Avoid wearing tight clothing.  Keep your skin clean and dry. Take showers or baths every day. Contact a doctor if:  Your cyst has symptoms of infection.  Your condition does not improve or gets worse.  You have a cyst that looks different from other cysts you have had.  You have a fever. Get help right away if:  Redness spreads from the cyst into the area close by. Summary  An epidermal cyst is a sac made of skin tissue.  If a cyst becomes infected, treatment may include surgery to open and drain the cyst, or to remove it.  Take over-the-counter and prescription medicines only as told by your doctor.  Contact a doctor if your condition is not improving or is getting worse.  Keep all follow-up visits as told by your doctor. This is important. This information is not intended to replace advice given to you by your health care provider. Make sure you discuss any questions you have with your health care provider. Document Released: 01/19/2005 Document Revised: 06/25/2018 Document Reviewed: 10/14/2015 Elsevier Interactive Patient Education  2019 [employer]   Epidermal Cyst Removal  Epidermal cyst removal is a procedure to remove a sac of oily material (keratin) that has formed under your skin (epidermal cyst). Epidermal cysts may also be called epidermoid cysts, sebaceous   cysts, or keratin cysts. Normally, the skin secretes this oily material through a gland or a hair follicle. However, when a skin gland or hair follicle becomes blocked, an epidermal cyst can form. You may need this procedure if you have an epidermal cyst that becomes large, uncomfortable, or infected. Tell a health care provider about:  Any allergies you have.  All medicines you are taking, including vitamins, herbs, eye  drops, creams, and over-the-counter medicines.  Any problems you or family members have had with anesthetic medicines.  Any blood disorders you have.  Any surgeries you have had.  Any medical conditions you have now or have had.  Whether you are pregnant or may be pregnant. What are the risks? Generally, this is a safe procedure. However, problems may occur, including:  Development of another cyst.  Bleeding.  Infection.  Scarring. What happens before the procedure?  Ask your health care provider about: ? Changing or stopping your regular medicines. This is especially important if you are taking diabetes medicines or blood thinners. ? Taking medicines such as aspirin and ibuprofen. These medicines can thin your blood. Do not take these medicines unless your health care provider tells you to take them. ? Taking over-the-counter medicines, vitamins, herbs, and supplements.  If you have an infected cyst, you may have to take antibiotic medicine before the cyst removal. Take your antibiotic as told by your health care provider. Do not stop taking the antibiotic even if you start to feel better.  Take a shower on the morning of your procedure. Your health care provider may ask you to use a germ-killing soap. What happens during the procedure?   You will be given a medicine to numb the area (local anesthetic).  The skin around the cyst will be cleaned with a germ-killing solution.  The health care provider will make a small incision in your skin over the cyst.  The health care provider will separate the cyst from the surrounding tissues that are under your skin.  If possible, the cyst will be removed undamaged (intact).  If the cyst bursts (ruptures), it will be removed in pieces.  After the cyst is removed, the health care provider will control any bleeding and close the incision with small stitches (sutures). Small incisions may not need sutures, and the bleeding will be  controlled by applying direct pressure with gauze.  The health care provider may apply antibiotic ointment and a bandage (dressing) over the incision. The procedure may vary among health care providers and hospitals. What happens after the procedure?  If your cyst ruptured during the procedure, you may need an antibiotic. If you are prescribed an antibiotic medicine or ointment, take or apply it as told by your health care provider. Do not stop using the antibiotic even if you start to feel better. Summary  Epidermal cyst removal is a procedure to remove a sac of oily material (keratin) that has formed under your skin (epidermal cyst).  You may need this procedure if you have an epidermal cyst that becomes large, uncomfortable, or infected.  The health care provider will make a small incision in your skin to remove the cyst.  If you are prescribed an antibiotic medicine before the procedure, after the procedure, or both, use the antibiotic as told by your health care provider. Do not stop using the antibiotic even if you start to feel better. This information is not intended to replace advice given to you by your health care provider. Make sure   discuss any questions you have with your health care provider. Document Released: 12/09/2000 Document Revised: 04/03/2018 Document Reviewed: 10/05/2017 Elsevier Interactive Patient Education  2019 [employer].

## 2019-02-27 ENCOUNTER — Ambulatory Visit: Payer: Medicaid Other | Admitting: Family Medicine

## 2019-02-28 ENCOUNTER — Encounter: Payer: Self-pay | Admitting: Family Medicine

## 2019-02-28 ENCOUNTER — Ambulatory Visit: Payer: Medicaid Other | Admitting: Family Medicine

## 2019-02-28 VITALS — BP 132/90 | HR 84 | Temp 98.1°F | Ht 70.0 in | Wt 285.0 lb

## 2019-02-28 DIAGNOSIS — Z7689 Persons encountering health services in other specified circumstances: Secondary | ICD-10-CM | POA: Diagnosis not present

## 2019-02-28 DIAGNOSIS — A549 Gonococcal infection, unspecified: Secondary | ICD-10-CM

## 2019-02-28 DIAGNOSIS — Z7251 High risk heterosexual behavior: Secondary | ICD-10-CM

## 2019-02-28 NOTE — Progress Notes (Signed)
Subjective: CC: f/u gonorrhea PCP: Raliegh Ip, DO OVF:IEPPIRJJO [place] is a 28 y.o. male presenting to clinic today for:  Patient was diagnosed and treated for gonorrhea in January.  He notes that he has had intercourse since that time.  He is sexually active with other men.  He has been using protection.  He does engage in oral and rectal sexual activity.  He denies any penile discharge, penile lesions, scrotal swelling or discoloration.  No rectal bleeding or discharge.  No rectal lesions.  No sore throat, rash, fevers, chills, myalgia or unplanned weight loss.  He has had history of syphilis in the past but was treated.  He is here for STD testing.   ROS: Per HPI  No Known Allergies Past Medical History:  Diagnosis Date  . Syphilis    treated at [facility]   No current outpatient medications on file. Social History   Socioeconomic History  . Marital status: Single    Spouse name: Not on file  . Number of children: Not on file  . Years of education: Not on file  . Highest education level: Not on file  Occupational History  . Not on file  Social Needs  . Financial resource strain: Not on file  . Food insecurity:    Worry: Not on file    Inability: Not on file  . Transportation needs:    Medical: Not on file    Non-medical: Not on file  Tobacco Use  . Smoking status: Current Every Day Smoker    Packs/day: 0.50    Types: Cigarettes  . Smokeless tobacco: Current User  Substance and Sexual Activity  . Alcohol use: No  . Drug use: No  . Sexual activity: Yes    Comment: bisexual  Lifestyle  . Physical activity:    Days per week: Not on file    Minutes per session: Not on file  . Stress: Not on file  Relationships  . Social connections:    Talks on phone: Not on file    Gets together: Not on file    Attends religious service: Not on file    Active member of club or organization: Not on file    Attends meetings of clubs or organizations: Not on file     Relationship status: Not on file  . Intimate partner violence:    Fear of current or ex partner: Not on file    Emotionally abused: Not on file    Physically abused: Not on file    Forced sexual activity: Not on file  Other Topics Concern  . Not on file  Social History Narrative  . Not on file   Family History  Problem Relation Age of Onset  . Diabetes Mother   . Hypertension Mother   . Hypertension Paternal Grandmother   . Diabetes Paternal Grandmother     Objective: Office vital signs reviewed. BP 132/90   Pulse 84   Temp 98.1 F (36.7 C) (Oral)   Ht 5\' 10"  (1.778 m)   Wt 285 lb (129.3 kg)   BMI 40.89 kg/m   Physical Examination:  General: Awake, alert, obese, No acute distress HEENT: Normal    Neck: No masses palpated. No lymphadenopathy    Eyes:: sclera white    Throat: MMM, no oropharyngeal erythema GU: no rectal lesions or tears noted. No discharge  Assessment/ Plan: 28 y.o. male   1. Gonorrhea in male Status post treatment for gonorrhea and chlamydia.  Recheck oropharyngeal and  rectal swabs.  Urine gonorrhea chlamydia testing also obtained.  Check STI panel.  I will contact him with results once available. - GC NAA, Pharyngeal - GC/Chlamydia Probe Amp  2. High risk sexual behavior, unspecified type - GC probe amplification, urine - GC NAA, Pharyngeal - GC/Chlamydia Probe Amp - STD Screen (8)   Orders Placed This Encounter  Procedures  . GC NAA, Pharyngeal  . GC/Chlamydia Probe Amp  . GC probe amplification, urine  . STD Screen (8)   No orders of the defined types were placed in this encounter.    Raliegh Ip, DO [facility] [phone]

## 2019-02-28 NOTE — Addendum Note (Signed)
Addended by: Raliegh Ip on: 02/28/2019 02:08 PM   Modules accepted: Orders

## 2019-03-01 ENCOUNTER — Encounter: Payer: Self-pay | Admitting: Family Medicine

## 2019-03-01 LAB — STD SCREEN (8)
HEP A IGM: NEGATIVE
HIV Screen 4th Generation wRfx: NONREACTIVE
HSV 1 Glycoprotein G Ab, IgG: 6.92 index — ABNORMAL HIGH (ref 0.00–0.90)
Hep B C IgM: NEGATIVE
Hep C Virus Ab: <0.1 s/co ratio (ref 0.0–0.9)
Hepatitis B Surface Ag: NEGATIVE
RPR Ser Ql: REACTIVE — AB

## 2019-03-01 LAB — RPR, QUANT. (REFLEX): Rapid Plasma Reagin, Quant: 1:2 {titer} — ABNORMAL HIGH

## 2019-03-02 LAB — CHLAMYDIA/GONOCOCCUS/TRICHOMONAS, NAA
Chlamydia by NAA: NEGATIVE
Gonococcus by NAA: NEGATIVE
Trich vag by NAA: NEGATIVE

## 2019-03-02 LAB — GC/CHLAMYDIA PROBE AMP
Chlamydia trachomatis, NAA: NEGATIVE
Neisseria gonorrhoeae by PCR: NEGATIVE

## 2019-03-02 NOTE — Telephone Encounter (Signed)
Called pt and spoke with him regarding STI results and answered all questions.

## 2019-03-03 LAB — GC NAA, PHARYNGEAL: N GONORRHOEA RRNA NPH QL PCR: NEGATIVE

## 2019-10-11 ENCOUNTER — Encounter: Payer: Self-pay | Admitting: Family

## 2019-10-11 ENCOUNTER — Ambulatory Visit: Payer: Medicaid Other | Admitting: Family

## 2019-10-11 ENCOUNTER — Other Ambulatory Visit: Payer: Self-pay

## 2019-10-11 VITALS — BP 137/89 | HR 77 | Temp 97.7°F | Ht 70.0 in | Wt 283.0 lb

## 2019-10-11 DIAGNOSIS — L0291 Cutaneous abscess, unspecified: Secondary | ICD-10-CM

## 2019-10-11 DIAGNOSIS — Z8619 Personal history of other infectious and parasitic diseases: Secondary | ICD-10-CM | POA: Diagnosis not present

## 2019-10-11 DIAGNOSIS — Z202 Contact with and (suspected) exposure to infections with a predominantly sexual mode of transmission: Secondary | ICD-10-CM

## 2019-10-11 MED ORDER — CEPHALEXIN 500 MG PO CAPS: 500.0000 mg | ORAL_CAPSULE | Freq: Three times a day (TID) | ORAL | 0 refills | Status: DC

## 2019-10-11 NOTE — Patient Instructions (Signed)
Skin Abscess  A skin abscess is an infected area on or under your skin that contains a collection of pus and other material. An abscess may also be called a furuncle, carbuncle, or boil. An abscess can occur in or on almost any part of your body. Some abscesses break open (rupture) on their own. Most continue to get worse unless they are treated. The infection can spread deeper into the body and eventually into your blood, which can make you feel ill. Treatment usually involves draining the abscess. What are the causes? An abscess occurs when germs, like bacteria, pass through your skin and cause an infection. This may be caused by:  A scrape or cut on your skin.  A puncture wound through your skin, including a needle injection or insect bite.  Blocked oil or sweat glands.  Blocked and infected hair follicles.  A cyst that forms beneath your skin (sebaceous cyst) and becomes infected. What increases the risk? This condition is more likely to develop in people who:  Have a weak body defense system (immune system).  Have diabetes.  Have dry and irritated skin.  Get frequent injections or use illegal IV drugs.  Have a foreign body in a wound, such as a splinter.  Have problems with their lymph system or veins. What are the signs or symptoms? Symptoms of this condition include:  A painful, firm bump under the skin.  A bump with pus at the top. This may break through the skin and drain. Other symptoms include:  Redness surrounding the abscess site.  Warmth.  Swelling of the lymph nodes (glands) near the abscess.  Tenderness.  A sore on the skin. How is this diagnosed? This condition may be diagnosed based on:  A physical exam.  Your medical history.  A sample of pus. This may be used to find out what is causing the infection.  Blood tests.  Imaging tests, such as an ultrasound, CT scan, or MRI. How is this treated? A small abscess that drains on its own may not  need treatment. Treatment for larger abscesses may include:  Moist heat or heat pack applied to the area several times a day.  A procedure to drain the abscess (incision and drainage).  Antibiotic medicines. For a severe abscess, you may first get antibiotics through an IV and then change to antibiotics by mouth. Follow these instructions at home: Medicines   Take over-the-counter and prescription medicines only as told by your health care provider.  If you were prescribed an antibiotic medicine, take it as told by your health care provider. Do not stop taking the antibiotic even if you start to feel better. Abscess care   If you have an abscess that has not drained, apply heat to the affected area. Use the heat source that your health care provider recommends, such as a moist heat pack or a heating pad. ? Place a towel between your skin and the heat source. ? Leave the heat on for 20-30 minutes. ? Remove the heat if your skin turns bright red. This is especially important if you are unable to feel pain, heat, or cold. You may have a greater risk of getting burned.  Follow instructions from your health care provider about how to take care of your abscess. Make sure you: ? Cover the abscess with a bandage (dressing). ? Change your dressing or gauze as told by your health care provider. ? Wash your hands with soap and water before you change the   dressing or gauze. If soap and water are not available, use hand sanitizer.  Check your abscess every day for signs of a worsening infection. Check for: ? More redness, swelling, or pain. ? More fluid or blood. ? Warmth. ? More pus or a bad smell. General instructions  To avoid spreading the infection: ? Do not share personal care items, towels, or hot tubs with others. ? Avoid making skin contact with other people.  Keep all follow-up visits as told by your health care provider. This is important. Contact a health care provider if you  have:  More redness, swelling, or pain around your abscess.  More fluid or blood coming from your abscess.  Warm skin around your abscess.  More pus or a bad smell coming from your abscess.  A fever.  Muscle aches.  Chills or a general ill feeling. Get help right away if you:  Have severe pain.  See red streaks on your skin spreading away from the abscess. Summary  A skin abscess is an infected area on or under your skin that contains a collection of pus and other material.  A small abscess that drains on its own may not need treatment.  Treatment for larger abscesses may include having a procedure to drain the abscess and taking an antibiotic. This information is not intended to replace advice given to you by your health care provider. Make sure you discuss any questions you have with your health care provider. Document Released: 09/21/2005 Document Revised: 04/04/2019 Document Reviewed: 01/25/2018 Elsevier Patient Education  2020 Elsevier Inc.  

## 2019-10-11 NOTE — Progress Notes (Signed)
Subjective:    Patient ID: Gerald [name], male    DOB: 04-09-1991, 28 y.o.   MRN: [id number]  Chief Complaint  Patient presents with  . check for STD and HIV    HPI  Pt presents to the office today for std check. He states states he has had unprotected sex twice over the last month with other men  and would like to be checked. He denies any discharge, dysuria, lesion, rash, or fevers.  He does report having unprotected oral sex about 10 times over the last month.   He does reports an abscess on his lower abdomen that he noticed a few weeks ago. He states it has been draining a small amount of yellow pus. Reports intermittent aching pain of 3 out 10.     Review of Systems  All other systems reviewed and are negative.      Objective:   Physical Exam Vitals signs reviewed.  Constitutional:      General: He is not in acute distress.    Appearance: He is well-developed.  HENT:     Head: Normocephalic.  Eyes:     General:        Right eye: No discharge.        Left eye: No discharge.     Pupils: Pupils are equal, round, and reactive to light.  Neck:     Musculoskeletal: Normal range of motion and neck supple.     Thyroid: No thyromegaly.  Cardiovascular:     Rate and Rhythm: Normal rate and regular rhythm.     Heart sounds: Normal heart sounds. No murmur.  Pulmonary:     Effort: Pulmonary effort is normal. No respiratory distress.     Breath sounds: Normal breath sounds. No wheezing.  Abdominal:     General: Bowel sounds are normal. There is no distension.     Palpations: Abdomen is soft.     Tenderness: There is no abdominal tenderness.  Musculoskeletal: Normal range of motion.        General: No tenderness.  Skin:    General: Skin is warm and dry.     Findings: Abscess present. No erythema or rash.          Comments: Small abscess, that is soft. No drainage present.   Neurological:     Mental Status: He is alert and oriented to person, place, and time.   Cranial Nerves: No cranial nerve deficit.     Deep Tendon Reflexes: Reflexes are normal and symmetric.  Psychiatric:        Behavior: Behavior normal.        Thought Content: Thought content normal.        Judgment: Judgment normal.     BP 137/89   Pulse 77   Temp 97.7 F (36.5 C) (Temporal)   Ht _0  (1.778 m)   Wt 283 lb (128.4 kg)   SpO2 98%   BMI 40.61 kg/m        Assessment & Plan:  [name] comes in today with chief complaint of check for STD and HIV   Diagnosis and orders addressed:  1. STD exposure Safe sex discussed - Chlamydia/Gonococcus/Trichomonas, NAA - STD Screen (8) - BMP8+EGFR - GC NAA, Pharyngeal - GC/Chlamydia Probe Amp([facility])   2. Abscess Warm compresses  Do not squeeze  Keep clean and dry - cephALEXin (KEFLEX) 500 MG capsule; Take 1 capsule (500 mg total) by mouth 3 (three) times daily.  Dispense: 21 capsule; Refill:  [place], [place]

## 2019-10-12 LAB — BMP8+EGFR
BUN/Creatinine Ratio: 10 (ref 9–20)
BUN: 10 mg/dL (ref 6–20)
CO2: 22 mmol/L (ref 20–29)
Calcium: 9.7 mg/dL (ref 8.7–10.2)
Chloride: 101 mmol/L (ref 96–106)
Creatinine, Ser: 0.98 mg/dL (ref 0.76–1.27)
GFR calc Af Amer: 123 mL/min/{1.73_m2} (ref >59)
GFR calc non Af Amer: 106 mL/min/{1.73_m2} (ref >59)
Glucose: 87 mg/dL (ref 65–99)
Potassium: 3.7 mmol/L (ref 3.5–5.2)
Sodium: 140 mmol/L (ref 134–144)

## 2019-10-12 LAB — RPR, QUANT. (REFLEX): Rapid Plasma Reagin, Quant: 1:2 {titer} — ABNORMAL HIGH

## 2019-10-12 LAB — STD SCREEN (8)
HIV Screen 4th Generation wRfx: NONREACTIVE
HSV 1 Glycoprotein G Ab, IgG: 18.7 index — ABNORMAL HIGH (ref 0.00–0.90)
HSV 2 IgG, Type Spec: 2.02 index — ABNORMAL HIGH (ref 0.00–0.90)
Hep A IgM: NEGATIVE
Hep B C IgM: NEGATIVE
Hep C Virus Ab: <0.1 s/co ratio (ref 0.0–0.9)
Hepatitis B Surface Ag: NEGATIVE
RPR Ser Ql: REACTIVE — AB

## 2019-10-12 LAB — HSV-2 IGG SUPPLEMENTAL TEST: HSV-2 IgG Supplemental Test: POSITIVE — AB

## 2019-10-14 ENCOUNTER — Telehealth: Payer: Self-pay | Admitting: Family Medicine

## 2019-10-14 LAB — GC/CHLAMYDIA PROBE AMP
Chlamydia trachomatis, NAA: POSITIVE — AB
Neisseria Gonorrhoeae by PCR: POSITIVE — AB

## 2019-10-14 NOTE — Telephone Encounter (Signed)
Apt scheduled with MMM 10/15/19.

## 2019-10-14 NOTE — Telephone Encounter (Signed)
Patient needs a visit to discuss these results in person, he will need an IM medication anyways for these results, please schedule him with the acute care provider ASAP to discuss results

## 2019-10-14 NOTE — Telephone Encounter (Signed)
Patient of Dr Darnell Level. Can you review so we can notify patient

## 2019-10-15 ENCOUNTER — Other Ambulatory Visit: Payer: Self-pay | Admitting: Family

## 2019-10-15 ENCOUNTER — Ambulatory Visit: Payer: Medicaid Other | Admitting: Family

## 2019-10-15 ENCOUNTER — Encounter: Payer: Self-pay | Admitting: Family

## 2019-10-15 ENCOUNTER — Other Ambulatory Visit: Payer: Self-pay

## 2019-10-15 VITALS — BP 151/93 | HR 94 | Temp 99.4°F | Ht 70.0 in | Wt 280.0 lb

## 2019-10-15 DIAGNOSIS — A64 Unspecified sexually transmitted disease: Secondary | ICD-10-CM

## 2019-10-15 DIAGNOSIS — A749 Chlamydial infection, unspecified: Secondary | ICD-10-CM

## 2019-10-15 DIAGNOSIS — A549 Gonococcal infection, unspecified: Secondary | ICD-10-CM | POA: Diagnosis not present

## 2019-10-15 DIAGNOSIS — A539 Syphilis, unspecified: Secondary | ICD-10-CM

## 2019-10-15 DIAGNOSIS — B009 Herpesviral infection, unspecified: Secondary | ICD-10-CM | POA: Diagnosis not present

## 2019-10-15 LAB — CHLAMYDIA/GONOCOCCUS/TRICHOMONAS, NAA
Chlamydia by NAA: NEGATIVE
Gonococcus by NAA: NEGATIVE
Trich vag by NAA: NEGATIVE

## 2019-10-15 LAB — GC NAA, PHARYNGEAL: N GONORRHOEA RRNA NPH QL PCR: NEGATIVE

## 2019-10-15 MED ORDER — CEFTRIAXONE SODIUM 1 G IJ SOLR
250.0000 mg | Freq: Once | INTRAMUSCULAR | Status: AC
  Administered 2019-10-15: 250 mg via INTRAMUSCULAR

## 2019-10-15 MED ORDER — PENICILLIN G BENZATHINE 1200000 UNIT/2ML IM SUSP
1.2000 10*6.[IU] | Freq: Once | INTRAMUSCULAR | Status: AC
  Administered 2019-10-15: 1.2 10*6.[IU] via INTRAMUSCULAR

## 2019-10-15 MED ORDER — VALACYCLOVIR HCL 1 G PO TABS: 1000.0000 mg | ORAL_TABLET | Freq: Two times a day (BID) | ORAL | 0 refills | Status: DC

## 2019-10-15 MED ORDER — AZITHROMYCIN 500 MG PO TABS: 1000.0000 mg | ORAL_TABLET | Freq: Once | ORAL | 0 refills | Status: AC

## 2019-10-15 NOTE — Patient Instructions (Addendum)
Genital Herpes Genital herpes is a common sexually transmitted infection (STI) that is caused by a virus. The virus spreads from person to person through sexual contact. Infection can cause itching, blisters, and sores around the genitals or rectum. Symptoms may last several days and then go away This is called an outbreak. However, the virus remains in your body, so you may have more outbreaks in the future. The time between outbreaks varies and can be months or years. Genital herpes affects men and women. It is particularly concerning for pregnant women because the virus can be passed to the baby during delivery and can cause serious problems. Genital herpes is also a concern for people who have a weak disease-fighting (immune) system. What are the causes? This condition is caused by the herpes simplex virus (HSV) type 1 or type 2. The virus may spread through:  Sexual contact with an infected person, including vaginal, anal, and oral sex.  Contact with fluid from a herpes sore.  The skin. This means that you can get herpes from an infected partner even if he or she does not have a visible sore or does not know that he or she is infected. What increases the risk? You are more likely to develop this condition if:  You have sex with many partners.  You do not use latex condoms during sex. What are the signs or symptoms? Most people do not have symptoms (asymptomatic) or have mild symptoms that may be mistaken for other skin problems. Symptoms may include:  Small red bumps near the genitals, rectum, or mouth. These bumps turn into blisters and then turn into sores.  Flu-like symptoms, including: ? Fever. ? Body aches. ? Swollen lymph nodes. ? Headache.  Painful urination.  Pain and itching in the genital area or rectal area.  Vaginal discharge.  Tingling or shooting pain in the legs and buttocks. Generally, symptoms are more severe and last longer during the first (primary)  outbreak. Flu-like symptoms are also more common during the primary outbreak. How is this diagnosed? Genital herpes may be diagnosed based on:  A physical exam.  Your medical history.  Blood tests.  A test of a fluid sample (culture) from an open sore. How is this treated? There is no cure for this condition, but treatment with antiviral medicines that are taken by mouth (orally) can do the following:  Speed up healing and relieve symptoms.  Help to reduce the spread of the virus to sexual partners.  Limit the chance of future outbreaks, or make future outbreaks shorter.  Lessen symptoms of future outbreaks. Your health care provider may also recommend pain relief medicines, such as aspirin or ibuprofen. Follow these instructions at home: Sexual activity  Do not have sexual contact during active outbreaks.  Practice safe sex. Latex condoms and male condoms may help prevent the spread of the herpes virus. General instructions  Keep the affected areas dry and clean.  Take over-the-counter and prescription medicines only as told by your health care provider.  Avoid rubbing or touching blisters and sores. If you do touch blisters or sores: ? Wash your hands thoroughly with soap and water. ? Do not touch your eyes afterward.  To help relieve pain or itching, you may take the following actions as directed by your health care provider: ? Apply a cold, wet cloth (cold compress) to affected areas 4-6 times a day. ? Apply a substance that protects your skin and reduces bleeding (astringent). ? Apply a gel that   helps relieve pain around sores (lidocaine gel). ? Take a warm, shallow bath that cleans the genital area (sitz bath).  Keep all follow-up visits as told by your health care provider. This is important. How is this prevented?  Use condoms. Although anyone can get genital herpes during sexual contact, even with the use of a condom, a condom can provide some protection.   Avoid having multiple sexual partners.  Talk with your sexual partner about any symptoms either of you may have. Also, talk with your partner about any history of STIs.  Get tested for STIs before you have sex. Ask your partner to do the same.  Do not have sexual contact if you have symptoms of genital herpes. Contact a health care provider if:  Your symptoms are not improving with medicine.  Your symptoms return.  You have new symptoms.  You have a fever.  You have abdominal pain.  You have redness, swelling, or pain in your eye.  You notice new sores on other parts of your body.  You are a woman and experience bleeding between menstrual periods.  You have had herpes and you become pregnant or plan to become pregnant. Summary  Genital herpes is a common sexually transmitted infection (STI) that is caused by the herpes simplex virus (HSV) type 1 or type 2.  These viruses are most often spread through sexual contact with an infected person.  You are more likely to develop this condition if you have sex with many partners or you have unprotected sex.  Most people do not have symptoms (asymptomatic) or have mild symptoms that may be mistaken for other skin problems. Symptoms occur as outbreaks that may happen months or years apart.  There is no cure for this condition, but treatment with oral antiviral medicines can reduce symptoms, reduce the chance of spreading the virus to a partner, prevent future outbreaks, or shorten future outbreaks. This information is not intended to replace advice given to you by your health care provider. Make sure you discuss any questions you have with your health care provider. Document Released: 12/09/2000 Document Revised: 06/18/2018 Document Reviewed: 11/11/2016 Elsevier Patient Education  2020 [employer]. Syphilis Syphilis is an infection that can spread through sexual contact. The infection can cause serious complications, so it is  important to get treatment right away. There are four stages of syphilis:  Primary stage. During this stage sores may form where the disease entered your body.  Secondary stage. During this stage skin rashes and lesions will form.  Latent stage. During this stage there are no symptoms, but the infection may still be contagious.  Tertiary stage. This stage happens 10-30 years after the infection starts. During this stage, the disease damages organs and can lead to death. Most people do not develop this stage of syphilis. What are the causes? This condition is caused by bacteria called Treponema pallidum. The condition can spread during sexual activity, such as during oral, anal, or vaginal sex. It can also be spread from mother to fetus during pregnancy. What increases the risk? You are more likely to develop this condition if:  You do not use a condom.  You have or had another sexually transmitted infection (STI).  You have multiple sex partners.  You use illegal drugs through an IV. What are the signs or symptoms? Symptoms of this condition depend on the stage of the disease. Primary stage  Painless sores (chancres) in and around the genital organs, mouth, or hands. The sores  are usually firm and round. Secondary stage  A rash or sores. The rash usually does not itch.  A fever.  A headache.  A sore throat.  Swollen lymph nodes.  New sores in the mouth or on the genitals.  A feeling of being ill.  Pain in the joints.  Patchy hair loss.  Weight loss.  Fatigue. Latent stage There are no symptoms during this stage. Tertiary stage  Dementia.  Personality and mood changes.  Difficulty walking and coordinating movements.  Muscle weakness or paralysis.  Problems with coordination.  Heart failure.  Trouble breathing.  Fainting.  Soft, rubbery growths on the skin, bones, or liver (gummas).  Sudden "lightning" pains, numbness, or tingling.  Vision  changes.  Hearing changes.  Trouble controlling your urine and bowel movements. How is this diagnosed? This condition is diagnosed with:  A physical exam.  Blood tests.  Tests of the the fluid (drainage) from a sore or rash.  Tests of the fluid around the spine (lumbar puncture). These tests are done to check for an infection in the brain or nervous system.  Imaging tests. These may be done to check for damage to the heart, aorta, or brain if the condition is in the tertiary stage. Tests may include: ? An X-ray. ? A CT scan. ? An MRI. ? An echocardiogram. This test takes a picture of the heart. ? An ultrasound. How is this treated? This condition can be cured with antibiotic medicine. During the first day of treatment, the medicine may cause you to experience fever, chills, headache, nausea, or aching all over your body. This is normal and should go away within 24 hours. Follow these instructions at home: Medicines   Take over-the-counter and prescription medicines only as told by your health care provider.  Take your antibiotic medicine as told by your health care provider. Do not stop taking the antibiotic even if you start to feel better. Incomplete treatment will put you at risk for continued infection and could be life threatening. General instructions  Do not have sex until your treatment is completed, or as directed by your health care provider.  Tell your recent sexual partners that you were diagnosed with syphilis. It is important that they get treatment, even if they do not have symptoms.  Keep all follow-up visits as told by your health care provider. This is important. How is this prevented?  Use latex condoms correctly whenever you have sex.  Before you have sex, ask your partner if he or she has been tested for STIs. Ask about the test results.  Avoid having multiple sexual partners. Contact a health care provider if:  You continue to have any of the  following symptoms 24 hours after beginning treatment: ? Fever. ? Chills. ? Headache. ? Nausea. ? Aching all over your body.  Your symptoms do not improve, even with treatment. Get help right away if:  You have severe chest pain.  You have trouble walking or coordinating movements.  You are confused.  You lose vision or hearing.  You have numbness in your arms or legs.  You have a seizure.  You faint.  You have a severe headache that does not go away with medicine. Summary  Syphilis is an infection that can spread through sexual contact.  This condition can cause serious complications, so it is best to get treatment right away. The condition can be cured with antibiotic medicine.  This condition can also be spread from mother to fetus  during pregnancy.  Take your antibiotic medicine as told by your health care provider.  Tell your recent sexual partners that you were diagnosed with syphilis. It is important that they get treatment, even if they do not have symptoms. This information is not intended to replace advice given to you by your health care provider. Make sure you discuss any questions you have with your health care provider. Document Released: 10/02/2013 Document Revised: 01/22/2019 Document Reviewed: 02/07/2017 Elsevier Patient Education  2020 [employer] Chlamydia, Male  Chlamydia is an STD (sexually transmitted disease). It is a bacterial infection that spreads through sexual contact (is contagious). Chlamydia can occur in different areas of the body, including the tube that moves urine from the bladder out of the body (urethra), the throat, or the rectum. This condition is not difficult to treat. However, if left untreated, chlamydia can lead to more serious health problems. What are the causes? Chlamydia is caused by the bacteria Chlamydia trachomatis. It is passed from an infected partner during sexual activity. Chlamydia can spread through contact with  the genitals, mouth, or rectum. What are the signs or symptoms? In some cases, there may not be any symptoms for this condition (asymptomatic), especially early in the infection. If symptoms develop, they may include:  Burning when urinating.  Urinating frequently.  Pain or swelling in the testicles.  Watery, mucus-like discharge from the penis.  Redness, soreness, and swelling (inflammation) of the rectum.  Bleeding or discharge from the rectum.  Abdominal pain.  Itching, burning, or redness in the eyes, or discharge from the eyes. How is this diagnosed? This condition may be diagnosed based on:  Urine tests.  Swab tests. Depending on your symptoms, your health care provider may use a cotton swab to collect discharge from your urethra or rectum to test for the bacteria. How is this treated? This condition is treated with antibiotic medicines. Follow these instructions at home: Medicines  Take over-the-counter and prescription medicines only as told by your health care provider.  Take your antibiotic medicine as told by your health care provider. Do not stop taking the antibiotic even if you start to feel better. Sexual activity  Tell sexual partners about your infection. This includes any oral, anal, or vaginal sex partners you have had within 60 days of when your symptoms started. Sexual partners should also be treated, even if they have no signs of the disease.  Do not have sex until you and your sexual partners have completed treatment and your health care provider says it is okay. If your health care provider prescribed you a single dose treatment, wait 7 days after taking the treatment before having sex. General instructions  It is your responsibility to get your test results. Ask your health care provider, or the department performing the test, when your results will be ready.  Get plenty of rest.  Eat a healthy, well-balanced diet.  Drink enough fluids to keep  your urine clear or pale yellow.  Keep all follow-up visits as told by your health care provider. This is important. You may need to be tested for infection again 3 months after treatment. How is this prevented? The only sure way to prevent chlamydia is to avoid sexual intercourse. However, you can lower your risk by:  Using latex condoms correctly every time you have sexual intercourse.  Not having multiple sexual partners.  Asking if your sexual partner has been tested for STIs and had negative results. Contact a health care provider if:  You develop new symptoms or your symptoms do not get better after completing treatment.  You have a fever or chills.  You have pain during sexual intercourse.  You develop new joint pain or swelling near your joints.  You have pain or soreness in your testicles. Get help right away if:  Your pain gets worse and does not get better with medicine.  You have abnormal discharge.  You develop flu-like symptoms, such as night sweats, sore throat, or muscle aches. Summary  Chlamydia is an STD (sexually transmitted disease). It is a bacterial infection that spreads (is contagious) through sexual contact.  This condition is not difficult to treat, however, if left untreated, it can lead to more serious health problems.  In some cases, there may not be any symptoms for this condition (asymptomatic).  This condition is treated with antibiotic medicines.  Using latex condoms correctly every time you have sexual intercourse can help prevent chlamydia. This information is not intended to replace advice given to you by your health care provider. Make sure you discuss any questions you have with your health care provider. Document Released: 12/12/2005 Document Revised: 12/27/2017 Document Reviewed: 11/28/2016 Elsevier Patient Education  2020 [employer]. Gonorrhea Gonorrhea is a sexually transmitted disease (STD) that can affect both men and women.  If left untreated, this infection can:  Damage the male or male organs.  Cause women and men to be unable to have children (be sterile).  Harm a fetus if an infected woman is pregnant. It is important to get treatment for gonorrhea as soon as possible. It is also necessary for all of your sexual partners to be tested for the infection. What are the causes? This condition is caused by bacteria called Neisseria gonorrhoeae. The infection is spread from person to person through sexual contact, including oral, anal, and vaginal sex. A newborn can contract the infection from his or her mother during birth. What increases the risk? The following factors may make you more likely to develop this condition:  Being a woman who is younger than 27 years of age and who is sexually active.  Being a woman 74 years of age or older who has: ? A new sex partner. ? More than one sex partner. ? A sex partner who has an STD.  Being a man who has: ? A new sex partner. ? More than one sex partner. ? A sex partner who has an STD.  Using condoms inconsistently.  Currently having, or having previously had, an STD.  Exchanging sex for money or drugs. What are the signs or symptoms? Some people do not have any symptoms. If you do have symptoms, they may be different for females and males. For females  Pain in the lower abdomen.  Abnormal vaginal discharge. The discharge may be cloudy, thick, or yellow-green in color.  Bleeding between periods.  Painful sex.  Burning or itching in and around the vagina.  Pain or burning when urinating.  Irritation, pain, bleeding, or discharge from the rectum. This may occur if the infection was spread by anal sex.  Sore throat or swollen lymph nodes in the neck. This may occur if the infection was spread by oral sex. For males  Abnormal discharge from the penis. This discharge may be cloudy, thick, or yellow-green in color.  Pain or burning during  urination.  Pain or swelling in the testicles.  Irritation, pain, bleeding, or discharge from the rectum. This may occur if the infection was spread by  anal sex.  Sore throat, fever, or swollen lymph nodes in the neck. This may occur if the infection was spread by oral sex. How is this diagnosed? This condition is diagnosed based on:  A physical exam.  A sample of discharge that is examined under a microscope to look for the bacteria. The discharge may be taken from the urethra, cervix, throat, or rectum.  Urine tests. Not all of test results will be available during your visit. How is this treated? This condition is treated with antibiotic medicines. It is important for treatment to begin as soon as possible. Early treatment may prevent some problems from developing. Do not have sex during treatment. Avoid all types of sexual activity for 7 days after treatment is complete and until any sex partners have been treated. Follow these instructions at home:  Take over-the-counter and prescription medicines only as told by your health care provider.  Take your antibiotic medicine as told by your health care provider. Do not stop taking the antibiotic even if you start to feel better.  Do not have sex until at least 7 days after you and your partner(s) have finished treatment and your health care provider says it is okay.  It is your responsibility to get your test results. Ask your health care provider, or the department performing the test, when your results will be ready.  If you test positive for gonorrhea, inform your recent sexual partners. This includes any oral, anal, or vaginal sex partners. They need to be checked for gonorrhea even if they do not have symptoms. They may need treatment, even if they test negative for gonorrhea.  Keep all follow-up visits as told by your health care provider. This is important. How is this prevented?   Use latex condoms correctly every time you  have sexual intercourse.  Ask if your sexual partner has been tested for STDs and had negative results.  Avoid having multiple sexual partners. Contact a health care provider if:  You develop a bad reaction to the medicine you were prescribed. This may include: ? A rash. ? Nausea. ? Vomiting. ? Diarrhea.  Your symptoms do not get better after a few days of taking antibiotics.  Your symptoms get worse.  You develop new symptoms.  Your pain gets worse.  You have a fever.  You develop pain, itching, or discharge around the eyes. Get help right away if:  You feel dizzy or faint.  You have trouble breathing or have shortness of breath.  You develop an irregular heartbeat.  You have severe abdominal pain with or without shoulder pain.  You develop any bumps or sores (lesions) on your skin.  You develop warmth, redness, pain, or swelling around your joints, such as the knee. Summary  Gonorrhea is an STD that can affect both men and women.  This condition is caused by bacteria called Neisseria gonorrhoeae. The infection is spread from person to person, usually through sexual contact, including oral, anal, and vaginal sex.  Symptoms vary between males and females. Generally, they include abnormal discharge and burning during urination. Women may also experience painful sex, itching around the vagina, and bleeding between menstrual periods. Men may also experience swelling of the testicles.  This condition is treated with antibiotic medicines. Do not have sex until at least 7 days after completing antibiotic treatment.  If left untreated, gonorrhea can have serious side effects and complications. This information is not intended to replace advice given to you by your health care  provider. Make sure you discuss any questions you have with your health care provider. Document Released: 12/09/2000 Document Revised: 01/18/2019 Document Reviewed: 11/11/2016 Elsevier Patient  Education  2020 [employer].

## 2019-10-15 NOTE — Progress Notes (Signed)
Subjective:    Patient ID: Gerald [name], male    DOB: 1991/08/16, 28 y.o.   MRN: [id number]  Chief Complaint  Patient presents with  . review test results    HPI Pt presents to the office today to discuss lab results. Pt was tested on 10/11/19 for STD. He was positive for chlamydia and gonorrhea. 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  His herpes 2 was also positive. This was a new result for him.   He reports he has had about 8-9 new partners over the last 6 months.    Review of Systems  All other systems reviewed and are negative.      Objective:   Physical Exam Vitals signs reviewed.  Constitutional:      General: He is not in acute distress.    Appearance: He is well-developed.  HENT:     Head: Normocephalic.     Right Ear: Tympanic membrane normal.     Left Ear: Tympanic membrane normal.  Eyes:     General:        Right eye: No discharge.        Left eye: No discharge.     Pupils: Pupils are equal, round, and reactive to light.  Neck:     Musculoskeletal: Normal range of motion and neck supple.     Thyroid: No thyromegaly.  Cardiovascular:     Rate and Rhythm: Normal rate and regular rhythm.     Heart sounds: Normal heart sounds. No murmur.  Pulmonary:     Effort: Pulmonary effort is normal. No respiratory distress.     Breath sounds: Normal breath sounds. No wheezing.  Abdominal:     General: Bowel sounds are normal. There is no distension.     Palpations: Abdomen is soft.     Tenderness: There is no abdominal tenderness.  Musculoskeletal: Normal range of motion.        General: No tenderness.  Skin:    General: Skin is warm and dry.     Findings: No erythema or rash.  Neurological:     Mental Status: He is alert and oriented to person, place, and time.     Cranial Nerves: No cranial nerve deficit.     Deep Tendon Reflexes: Reflexes are normal and symmetric.  Psychiatric:        Mood and Affect: Mood is  anxious.        Behavior: Behavior normal.        Thought Content: Thought content normal.        Judgment: Judgment normal.     BP (!) 151/93   Pulse 94   Temp 99.4 F (37.4 C) (Temporal)   Ht 5\' 10"  (1.778 m)   Wt 280 lb (127 kg)   SpO2 97%   BMI 40.18 kg/m        Assessment & Plan:  Gerald [name] comes in today with chief complaint of review test results   Diagnosis and orders addressed:  1. Gonorrhea in male  2. Chlamydia  3. Syphilis  4. Herpes simplex type 2 infection - valACYclovir (VALTREX) 1000 MG tablet; Take 1 tablet (1,000 mg total) by mouth 2 (two) times daily.  Dispense: 20 tablet; Refill: 0   Given Rocephin 250 mg and Penicillin G 2.4 million units today He will pick up rx of Azithromycin today and take Long discussion about safe sex Greater than 30 mins spent counseling patient on diseases and preventions  He will return in 3 months for further testing  No sex for at least 7 days  Gerald Rodney, FNP

## 2019-10-18 LAB — SPECIMEN STATUS REPORT

## 2019-10-18 LAB — RPR TITER (REFLEX): RPR: REACTIVE — AB

## 2019-10-18 LAB — RPR, QUANT: RPR, Quant: 1:2 {titer} — ABNORMAL HIGH

## 2019-10-18 LAB — T PALLIDUM SCREENING CASCADE: T pallidum Antibodies (TP-PA): REACTIVE — AB

## 2019-10-21 ENCOUNTER — Ambulatory Visit: Payer: Medicaid Other | Admitting: Family Medicine

## 2019-10-24 ENCOUNTER — Ambulatory Visit (INDEPENDENT_AMBULATORY_CARE_PROVIDER_SITE_OTHER): Payer: Medicaid Other

## 2019-10-24 ENCOUNTER — Other Ambulatory Visit: Payer: Self-pay

## 2019-10-24 DIAGNOSIS — A64 Unspecified sexually transmitted disease: Secondary | ICD-10-CM | POA: Diagnosis not present

## 2019-10-24 MED ORDER — PENICILLIN G BENZATHINE 1200000 UNIT/2ML IM SUSP
2.4000 10*6.[IU] | Freq: Once | INTRAMUSCULAR | Status: AC
  Administered 2019-10-24: 2.4 10*6.[IU] via INTRAMUSCULAR

## 2019-10-24 NOTE — Progress Notes (Signed)
Bicillin 2,400,000 units given.  1,200,000 given to right upper outer quadrant and 1,200,000 given to left upper outer quadrant.  Patient tolerated well.

## 2019-10-31 ENCOUNTER — Other Ambulatory Visit: Payer: Self-pay

## 2019-10-31 ENCOUNTER — Ambulatory Visit (INDEPENDENT_AMBULATORY_CARE_PROVIDER_SITE_OTHER): Payer: Medicaid Other

## 2019-10-31 DIAGNOSIS — A64 Unspecified sexually transmitted disease: Secondary | ICD-10-CM | POA: Diagnosis not present

## 2019-10-31 MED ORDER — PENICILLIN G BENZATHINE 1200000 UNIT/2ML IM SUSP
1.2000 10*6.[IU] | Freq: Once | INTRAMUSCULAR | Status: AC
  Administered 2019-10-31: 1.2 10*6.[IU] via INTRAMUSCULAR

## 2019-10-31 NOTE — Progress Notes (Signed)
Bicillin injections 2.4 given to left upper outer quadrant.  Patient tolerated well.

## 2019-11-04 ENCOUNTER — Ambulatory Visit: Payer: Medicaid Other | Admitting: Family

## 2019-11-07 ENCOUNTER — Ambulatory Visit: Payer: Medicaid Other | Admitting: Family Medicine

## 2019-11-28 ENCOUNTER — Other Ambulatory Visit: Payer: Self-pay

## 2019-11-29 ENCOUNTER — Ambulatory Visit: Payer: Medicaid Other | Admitting: Family

## 2019-12-11 ENCOUNTER — Ambulatory Visit: Payer: Medicaid Other | Admitting: Family

## 2020-01-07 ENCOUNTER — Other Ambulatory Visit: Payer: Self-pay

## 2020-01-08 ENCOUNTER — Encounter: Payer: Self-pay | Admitting: Family

## 2020-01-08 ENCOUNTER — Ambulatory Visit: Payer: Medicaid Other | Admitting: Family

## 2020-01-08 VITALS — BP 158/117 | HR 69 | Temp 99.3°F | Ht 70.0 in | Wt 287.0 lb

## 2020-01-08 DIAGNOSIS — A749 Chlamydial infection, unspecified: Secondary | ICD-10-CM | POA: Diagnosis not present

## 2020-01-08 DIAGNOSIS — A64 Unspecified sexually transmitted disease: Secondary | ICD-10-CM | POA: Diagnosis not present

## 2020-01-08 DIAGNOSIS — F17200 Nicotine dependence, unspecified, uncomplicated: Secondary | ICD-10-CM

## 2020-01-08 DIAGNOSIS — Z8619 Personal history of other infectious and parasitic diseases: Secondary | ICD-10-CM

## 2020-01-08 DIAGNOSIS — I10 Essential (primary) hypertension: Secondary | ICD-10-CM | POA: Diagnosis not present

## 2020-01-08 DIAGNOSIS — A539 Syphilis, unspecified: Secondary | ICD-10-CM

## 2020-01-08 DIAGNOSIS — Z6838 Body mass index (BMI) 38.0-38.9, adult: Secondary | ICD-10-CM

## 2020-01-08 DIAGNOSIS — A549 Gonococcal infection, unspecified: Secondary | ICD-10-CM | POA: Diagnosis not present

## 2020-01-08 DIAGNOSIS — Z7251 High risk heterosexual behavior: Secondary | ICD-10-CM

## 2020-01-08 DIAGNOSIS — E6609 Other obesity due to excess calories: Secondary | ICD-10-CM

## 2020-01-08 DIAGNOSIS — Z720 Tobacco use: Secondary | ICD-10-CM

## 2020-01-08 MED ORDER — LISINOPRIL 20 MG PO TABS: 20.0000 mg | ORAL_TABLET | Freq: Every day | ORAL | 3 refills | Status: DC

## 2020-01-08 NOTE — Progress Notes (Signed)
Subjective:    Patient ID: Gerald [name], male    DOB: Oct 07, 1991, 29 y.o.   MRN: [id number]  Chief Complaint  Patient presents with  . SEXUALLY TRANSMITTED DISEASE   PT presents to the office today to be rechecked for STD's. Pt was seen on 10/11/19 and was diagnosed with syphilis, herpes 2,  gonorrhea and chlamydia.   He was treated with Rocephin and Azithromycin. Started on Valtrex daily. He was given Bicillin 2.4 units twice.   He reports he has not had any sexual activity since October.  Denies dysuria, fever, penile discharge, or rash.  Hypertension This is a chronic problem. The current episode started more than 1 year ago. The problem is uncontrolled. Pertinent negatives include no malaise/fatigue, peripheral edema or shortness of breath. Risk factors for coronary artery disease include obesity and smoking/tobacco exposure. Past treatments include nothing. The current treatment provides no improvement.      Review of Systems  Constitutional: Negative for malaise/fatigue.  Respiratory: Negative for shortness of breath.   All other systems reviewed and are negative.      Objective:   Physical Exam Vitals reviewed.  Constitutional:      General: He is not in acute distress.    Appearance: He is well-developed. He is obese.  HENT:     Head: Normocephalic.  Eyes:     General:        Right eye: No discharge.        Left eye: No discharge.     Pupils: Pupils are equal, round, and reactive to light.  Neck:     Thyroid: No thyromegaly.  Cardiovascular:     Rate and Rhythm: Normal rate and regular rhythm.     Heart sounds: Normal heart sounds. No murmur.  Pulmonary:     Effort: Pulmonary effort is normal. No respiratory distress.     Breath sounds: Normal breath sounds. No wheezing.  Abdominal:     General: Bowel sounds are normal. There is no distension.     Palpations: Abdomen is soft.     Tenderness: There is no abdominal tenderness.  Genitourinary:  Penis: Normal.      Rectum: Normal.  Musculoskeletal:        General: No tenderness. Normal range of motion.     Cervical back: Normal range of motion and neck supple.  Skin:    General: Skin is warm and dry.     Findings: No erythema or rash.  Neurological:     Mental Status: He is alert and oriented to person, place, and time.     Cranial Nerves: No cranial nerve deficit.     Deep Tendon Reflexes: Reflexes are normal and symmetric.  Psychiatric:        Behavior: Behavior normal.        Thought Content: Thought content normal.        Judgment: Judgment normal.       BP (!) 158/117   Pulse 69   Temp 99.3 F (37.4 C) (Temporal)   Ht 5' 10"  (1.778 m)   Wt 287 lb (130.2 kg)   SpO2 97%   BMI 41.18 kg/m      Assessment & Plan:  Gerald [name] comes in today with chief complaint of SEXUALLY TRANSMITTED DISEASE   Diagnosis and orders addressed:  1. STD (sexually transmitted disease) - BMP8+EGFR - STD Screen (8) - Chlamydia/Gonococcus/Trichomonas, NAA - CT/NG RNA, TMA Rectal - Ct/GC NAA, Pharyngeal  2. Essential hypertension Will start lisinopril 20  mg today -Dash diet information given -Exercise encouraged - Stress Management  -Continue current meds -RTO in 2 weeks  - lisinopril (ZESTRIL) 20 MG tablet; Take 1 tablet (20 mg total) by mouth daily.  Dispense: 90 tablet; Refill: 3 - BMP8+EGFR  3. Syphilis, unspecified - BMP8+EGFR - STD Screen (8) - Chlamydia/Gonococcus/Trichomonas, NAA - CT/NG RNA, TMA Rectal - Ct/GC NAA, Pharyngeal  4. Chlamydia - BMP8+EGFR - STD Screen (8) - Chlamydia/Gonococcus/Trichomonas, NAA - CT/NG RNA, TMA Rectal - Ct/GC NAA, Pharyngeal  5. Gonorrhea - BMP8+EGFR - STD Screen (8) - Chlamydia/Gonococcus/Trichomonas, NAA - CT/NG RNA, TMA Rectal - Ct/GC NAA, Pharyngeal  6. Current smoker  7. Tobacco abuse Smoking cessation discussed  8. History of syphilis  9. Class 2 obesity due to excess calories without serious  comorbidity with body mass index (BMI) of 38.0 to 38.9 in adult  10. High risk sexual behavior, unspecified type Safe sex discussed  Pt is planning to make appt with PCP  To discuss Truvada   Labs pending Health Maintenance reviewed Diet and exercise encouraged  Follow up plan: 2-4 weeks to recheck HTN   Evelina Dun, FNP

## 2020-01-08 NOTE — Patient Instructions (Signed)

## 2020-01-09 LAB — STD SCREEN (8)
HIV Screen 4th Generation wRfx: NONREACTIVE
HSV 1 Glycoprotein G Ab, IgG: 9.34 index — ABNORMAL HIGH (ref 0.00–0.90)
HSV 2 IgG, Type Spec: 2 index — ABNORMAL HIGH (ref 0.00–0.90)
Hep A IgM: NEGATIVE
Hep B C IgM: NEGATIVE
Hep C Virus Ab: <0.1 s/co ratio (ref 0.0–0.9)
Hepatitis B Surface Ag: NEGATIVE
RPR Ser Ql: REACTIVE — AB

## 2020-01-09 LAB — BMP8+EGFR
BUN/Creatinine Ratio: 14 (ref 9–20)
BUN: 11 mg/dL (ref 6–20)
CO2: 24 mmol/L (ref 20–29)
Calcium: 9.7 mg/dL (ref 8.7–10.2)
Chloride: 101 mmol/L (ref 96–106)
Creatinine, Ser: 0.78 mg/dL (ref 0.76–1.27)
GFR calc Af Amer: 143 mL/min/{1.73_m2} (ref >59)
GFR calc non Af Amer: 124 mL/min/{1.73_m2} (ref >59)
Glucose: 73 mg/dL (ref 65–99)
Potassium: 4 mmol/L (ref 3.5–5.2)
Sodium: 140 mmol/L (ref 134–144)

## 2020-01-09 LAB — HSV-2 IGG SUPPLEMENTAL TEST: HSV-2 IgG Supplemental Test: POSITIVE — AB

## 2020-01-09 LAB — RPR, QUANT. (REFLEX): Rapid Plasma Reagin, Quant: 1:2 {titer} — ABNORMAL HIGH

## 2020-01-10 LAB — CT/NG RNA, TMA RECTAL
Chlamydia trachomatis, NAA: NEGATIVE
Neisseria gonorrhoeae, NAA: NEGATIVE

## 2020-01-10 LAB — CHLAMYDIA/GONOCOCCUS/TRICHOMONAS, NAA
Chlamydia by NAA: NEGATIVE
Gonococcus by NAA: NEGATIVE
Trich vag by NAA: NEGATIVE

## 2020-01-10 LAB — CT/GC NAA, PHARYNGEAL
C TRACH RRNA NPH QL PCR: NEGATIVE
N GONORRHOEA RRNA NPH QL PCR: NEGATIVE

## 2020-01-14 ENCOUNTER — Other Ambulatory Visit: Payer: Self-pay | Admitting: Family

## 2020-01-14 DIAGNOSIS — A539 Syphilis, unspecified: Secondary | ICD-10-CM

## 2020-02-04 ENCOUNTER — Other Ambulatory Visit: Payer: Self-pay

## 2020-02-05 ENCOUNTER — Ambulatory Visit: Payer: Medicaid Other | Admitting: Family Medicine

## 2020-02-26 ENCOUNTER — Ambulatory Visit: Payer: Medicaid Other | Admitting: Family Medicine

## 2020-03-26 ENCOUNTER — Ambulatory Visit: Payer: Medicaid Other | Admitting: Family Medicine

## 2020-06-02 ENCOUNTER — Ambulatory Visit: Payer: Medicaid Other | Admitting: Family Medicine

## 2020-07-10 ENCOUNTER — Telehealth: Payer: Self-pay | Admitting: Family Medicine

## 2020-07-10 NOTE — Telephone Encounter (Signed)
See telephone encounter from today.

## 2020-07-10 NOTE — Telephone Encounter (Signed)
Pt states he has an "open wound" in the fold below his stomach. Scheduled pt 07/14/20 at 9:30.

## 2020-07-10 NOTE — Telephone Encounter (Signed)
Pt called stating that there is some kind of open wound under his stomach and he just wants a nurses advise on what it could be. Pt is going to take a picture of it and upload it to his mychart. Would like a nurse to look at it and see what it could be and call him back.

## 2020-07-14 ENCOUNTER — Other Ambulatory Visit: Payer: Self-pay

## 2020-07-14 ENCOUNTER — Ambulatory Visit (INDEPENDENT_AMBULATORY_CARE_PROVIDER_SITE_OTHER): Payer: Medicaid Other | Admitting: Nurse Practitioner

## 2020-07-14 ENCOUNTER — Encounter: Payer: Self-pay | Admitting: Nurse Practitioner

## 2020-07-14 VITALS — BP 129/89 | HR 88 | Temp 98.1°F | Ht 70.0 in | Wt 281.4 lb

## 2020-07-14 DIAGNOSIS — K632 Fistula of intestine: Secondary | ICD-10-CM | POA: Diagnosis not present

## 2020-07-14 MED ORDER — SULFAMETHOXAZOLE-TRIMETHOPRIM 800-160 MG PO TABS: 1.0000 | ORAL_TABLET | Freq: Two times a day (BID) | ORAL | 0 refills | Status: DC

## 2020-07-14 NOTE — Progress Notes (Signed)
   Subjective:    Patient ID: Raynald Rouillard, male    DOB: 1991/07/07, 29 y.o.   MRN: [id number]   Chief Complaint: Wound Infection (lower abdomen, groin)   HPI States issue of possible ingrown hair since January, begins to bleed when putting on clothes. 3/10 pain, burning/itching sensation. Denies any intervention.   Review of Systems  Constitutional: Negative.   HENT: Negative.   Eyes: Negative.   Respiratory: Negative.   Cardiovascular: Negative.   Gastrointestinal: Negative.   Endocrine: Negative.   Genitourinary: Negative.   Musculoskeletal: Negative.   Skin: Positive for wound.  Allergic/Immunologic: Negative.   Neurological: Negative.   Hematological: Negative.   Psychiatric/Behavioral: Negative.   All other systems reviewed and are negative.      Objective:   Physical Exam Vitals and nursing note reviewed.  HENT:     Head: Normocephalic and atraumatic.     Nose: Nose normal.     Mouth/Throat:     Mouth: Mucous membranes are moist.     Pharynx: Oropharynx is clear.  Eyes:     Pupils: Pupils are equal, round, and reactive to light.  Cardiovascular:     Rate and Rhythm: Normal rate.     Pulses: Normal pulses.  Pulmonary:     Effort: Pulmonary effort is normal.  Abdominal:     General: Bowel sounds are normal.  Musculoskeletal:        General: Normal range of motion.     Cervical back: Normal range of motion.  Skin:    General: Skin is warm and dry.     Capillary Refill: Capillary refill takes less than 2 seconds.     Findings: Lesion present.          Comments: ~1inch fistula lesion with tunneling, small amount of yellow exudate   Neurological:     General: No focal deficit present.     Mental Status: He is alert and oriented to person, place, and time. Mental status is at baseline.  Psychiatric:        Mood and Affect: Mood normal.        Behavior: Behavior normal.        Thought Content: Thought content normal.        Judgment: Judgment  normal.    BP 129/89   Pulse 88   Temp 98.1 F (36.7 C) (Temporal)   Ht 5\' 10"  (1.778 m)   Wt 281 lb 6.4 oz (127.6 kg)   BMI 40.38 kg/m      Assessment & Plan:  Joven Mom in today with chief complaint of Wound Infection (lower abdomen, groin)   1. Abdominal wall fistula Keep area covered so clothes do not rub against it Do not pick or scratch at area Referral made to general surgeon. - sulfamethoxazole-trimethoprim (BACTRIM DS) 800-160 MG tablet; Take 1 tablet by mouth 2 (two) times daily.  Dispense: 20 tablet; Refill: 0 - Ambulatory referral to General Surgery    The above assessment and management plan was discussed with the patient. The patient verbalized understanding of and has agreed to the management plan. Patient is aware to call the clinic if symptoms persist or worsen. Patient is aware when to return to the clinic for a follow-up visit. Patient educated on when it is appropriate to go to the emergency department.   Mary-Margaret Cassell Clement, FNP

## 2020-07-23 ENCOUNTER — Ambulatory Visit: Payer: Medicaid Other | Admitting: General Surgery

## 2020-07-31 ENCOUNTER — Ambulatory Visit: Payer: Medicaid Other | Admitting: Family Medicine

## 2020-08-11 ENCOUNTER — Ambulatory Visit: Payer: Medicaid Other | Admitting: General Surgery

## 2020-08-25 ENCOUNTER — Ambulatory Visit: Payer: Medicaid Other | Admitting: General Surgery

## 2020-09-01 ENCOUNTER — Other Ambulatory Visit: Payer: Self-pay

## 2020-09-01 ENCOUNTER — Other Ambulatory Visit (HOSPITAL_COMMUNITY)
Admission: RE | Admit: 2020-09-01 | Discharge: 2020-09-01 | Disposition: A | Discharge status: Home / Self Care | Payer: Medicaid Other | Source: Ambulatory Visit | Attending: Family Medicine | Admitting: Family Medicine

## 2020-09-01 ENCOUNTER — Encounter: Payer: Self-pay | Admitting: Family Medicine

## 2020-09-01 ENCOUNTER — Ambulatory Visit (INDEPENDENT_AMBULATORY_CARE_PROVIDER_SITE_OTHER): Payer: Medicaid Other | Admitting: Family Medicine

## 2020-09-01 VITALS — BP 138/89 | HR 85 | Temp 98.7°F | Ht 70.0 in | Wt 280.0 lb

## 2020-09-01 DIAGNOSIS — B009 Herpesviral infection, unspecified: Secondary | ICD-10-CM | POA: Diagnosis not present

## 2020-09-01 DIAGNOSIS — I10 Essential (primary) hypertension: Secondary | ICD-10-CM

## 2020-09-01 DIAGNOSIS — Z113 Encounter for screening for infections with a predominantly sexual mode of transmission: Secondary | ICD-10-CM | POA: Insufficient documentation

## 2020-09-01 MED ORDER — VALACYCLOVIR HCL 1 G PO TABS: 1000.0000 mg | ORAL_TABLET | Freq: Every day | ORAL | 3 refills | Status: DC

## 2020-09-01 NOTE — Patient Instructions (Signed)

## 2020-09-01 NOTE — Progress Notes (Signed)
Subjective: CC: HTN PCP: Gerald Norlander, DO Gerald [name] is a 29 y.o. male presenting to clinic today for:  1. Hypertension No longer taking Lisinopril.  No CP, SOB  2. 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  He engages in MSM and has had 3 or 4 partners since his last STD checkup.  2 of the 4 have been unprotected.  He denies any sore throat, unplanned weight loss, night sweats, fevers, genital pain, sores, discharge, scrotal swelling, rectal bleeding.  Medical history significant for HSV 1 and 2.  Has not been compliant with Valtrex since he is run out.  He also has a history of syphilis that has been treated.  He is interested in pursuing prep.  No known exposure to HIV.  ROS: Per HPI  No Known Allergies Past Medical History:  Diagnosis Date  . Syphilis    treated at [facility]    Current Outpatient Medications:  .  lisinopril (ZESTRIL) 20 MG tablet, Take 1 tablet (20 mg total) by mouth daily., Disp: 90 tablet, Rfl: 3 .  valACYclovir (VALTREX) 1000 MG tablet, Take 1 tablet (1,000 mg total) by mouth 2 (two) times daily., Disp: 20 tablet, Rfl: 0 Social History   Socioeconomic History  . Marital status: Single    Spouse name: Not on file  . Number of children: Not on file  . Years of education: Not on file  . Highest education level: Not on file  Occupational History  . Not on file  Tobacco Use  . Smoking status: Current Every Day Smoker    Packs/day: 0.50    Types: Cigarettes  . Smokeless tobacco: Current User  Vaping Use  . Vaping Use: Never used  Substance and Sexual Activity  . Alcohol use: No  . Drug use: No  . Sexual activity: Yes    Comment: bisexual  Other Topics Concern  . Not on file  Social History Narrative  . Not on file   Social Determinants of Health   Financial Resource Strain:   . Difficulty of Paying Living Expenses: Not on file  Food Insecurity:   . Worried About Sales executive in the Last Year: Not on file  . Ran Out of Food in the Last Year: Not on file  Transportation Needs:   . Lack of Transportation (Medical): Not on file  . Lack of Transportation (Non-Medical): Not on file  Physical Activity:   . Days of Exercise per Week: Not on file  . Minutes of Exercise per Session: Not on file  Stress:   . Feeling of Stress : Not on file  Social Connections:   . Frequency of Communication with Friends and Family: Not on file  . Frequency of Social Gatherings with Friends and Family: Not on file  . Attends Religious Services: Not on file  . Active Member of Clubs or Organizations: Not on file  . Attends Archivist Meetings: Not on file  . Marital Status: Not on file  Intimate Partner Violence:   . Fear of Current or Ex-Partner: Not on file  . Emotionally Abused: Not on file  . Physically Abused: Not on file  . Sexually Abused: Not on file   Family History  Problem Relation Age of Onset  . Diabetes Mother   . Hypertension Mother   . Hypertension Paternal Grandmother   . Diabetes Paternal Grandmother     Objective: Office vital signs reviewed. BP  138/89   Pulse 85   Temp 98.7 F (37.1 C) (Temporal)   Ht 5' 10"  (1.778 m)   Wt 280 lb (127 kg)   SpO2 97%   BMI 40.18 kg/m   Physical Examination:  General: Awake, alert, well nourished, No acute distress HEENT: Normal, sclera white, oropharynx without erythema.  No lymphadenopathy. Cardio: regular rate and rhythm, S1S2 heard, no murmurs appreciated Pulm: clear to auscultation bilaterally, no wheezes, rhonchi or rales; normal work of breathing on room air GU: No penile lesions appreciated.  No scrotal swelling. GI: No rectal bleeding or tears appreciated.  Assessment/ Plan: 29 y.o. male   Essential hypertension - Plan: CMP14+EGFR  Screening examination for STD (sexually transmitted disease) - Plan: Cytology (oral, anal, urethral) ancillary only, Cytology (oral, anal, urethral)  ancillary only, Cytology (oral, anal, urethral) ancillary only, STD Screen (8)  HSV-2 infection - Plan: valACYclovir (VALTREX) 1000 MG tablet  Off of blood pressure medication.  Blood pressures technically considered controlled but I reemphasized need for DASH diet.  Check CMP.  STD screening obtained.  We will plan to initiate Biktarvy or Truvada pending lab work-up.  He will need to follow-up in 1 month for recheck.  Reemphasized condom use with each intercourse.  No orders of the defined types were placed in this encounter.  No orders of the defined types were placed in this encounter.    Gerald Norlander, DO [place] [phone]

## 2020-09-02 ENCOUNTER — Other Ambulatory Visit: Payer: Self-pay | Admitting: Family Medicine

## 2020-09-02 ENCOUNTER — Telehealth: Payer: Self-pay | Admitting: Pharmacist

## 2020-09-02 DIAGNOSIS — Z7252 High risk homosexual behavior: Secondary | ICD-10-CM

## 2020-09-02 LAB — CMP14+EGFR
ALT: 38 IU/L (ref 0–44)
AST: 20 IU/L (ref 0–40)
Albumin/Globulin Ratio: 1.3 (ref 1.2–2.2)
Albumin: 4.4 g/dL (ref 4.1–5.2)
Alkaline Phosphatase: 92 IU/L (ref 48–121)
BUN/Creatinine Ratio: 8 — ABNORMAL LOW (ref 9–20)
BUN: 8 mg/dL (ref 6–20)
Bilirubin Total: 0.3 mg/dL (ref 0.0–1.2)
CO2: 25 mmol/L (ref 20–29)
Calcium: 9.8 mg/dL (ref 8.7–10.2)
Chloride: 100 mmol/L (ref 96–106)
Creatinine, Ser: 0.98 mg/dL (ref 0.76–1.27)
GFR calc Af Amer: 122 mL/min/{1.73_m2} (ref >59)
GFR calc non Af Amer: 105 mL/min/{1.73_m2} (ref >59)
Globulin, Total: 3.5 g/dL (ref 1.5–4.5)
Glucose: 87 mg/dL (ref 65–99)
Potassium: 4.2 mmol/L (ref 3.5–5.2)
Sodium: 138 mmol/L (ref 134–144)
Total Protein: 7.9 g/dL (ref 6.0–8.5)

## 2020-09-02 LAB — STD SCREEN (8)
HIV Screen 4th Generation wRfx: NONREACTIVE
HSV 1 Glycoprotein G Ab, IgG: 4.98 index — ABNORMAL HIGH (ref 0.00–0.90)
HSV 2 IgG, Type Spec: 4.16 index — ABNORMAL HIGH (ref 0.00–0.90)
Hep A IgM: NEGATIVE
Hep B C IgM: NEGATIVE
Hep C Virus Ab: <0.1 s/co ratio (ref 0.0–0.9)
Hepatitis B Surface Ag: NEGATIVE
RPR Ser Ql: REACTIVE — AB

## 2020-09-02 LAB — HSV-2 IGG SUPPLEMENTAL TEST: HSV-2 IgG Supplemental Test: POSITIVE — AB

## 2020-09-02 LAB — RPR, QUANT. (REFLEX): Rapid Plasma Reagin, Quant: 1:2 {titer} — ABNORMAL HIGH

## 2020-09-02 MED ORDER — DESCOVY 200-25 MG PO TABS: 1.0000 | ORAL_TABLET | Freq: Every day | ORAL | 0 refills | Status: DC

## 2020-09-02 NOTE — Progress Notes (Signed)
Descovy for PrEP covered under Medicaid for $3

## 2020-09-02 NOTE — Telephone Encounter (Signed)
Discussed descovy with patient He verbalized understanding RX cost $3 , ready for pick up

## 2020-09-03 LAB — CYTOLOGY, (ORAL, ANAL, URETHRAL) ANCILLARY ONLY
Chlamydia: NEGATIVE
Chlamydia: NEGATIVE
Chlamydia: NEGATIVE
Comment: NEGATIVE
Comment: NEGATIVE
Comment: NEGATIVE
Comment: NEGATIVE
Comment: NORMAL
Comment: NORMAL
Comment: NEGATIVE
Comment: NEGATIVE
Comment: NORMAL
Neisseria Gonorrhea: NEGATIVE
Neisseria Gonorrhea: NEGATIVE
Neisseria Gonorrhea: NEGATIVE
Trichomonas: NEGATIVE
Trichomonas: NEGATIVE
Trichomonas: NEGATIVE

## 2020-10-02 ENCOUNTER — Ambulatory Visit (INDEPENDENT_AMBULATORY_CARE_PROVIDER_SITE_OTHER): Payer: Medicaid Other | Admitting: Family Medicine

## 2020-10-02 DIAGNOSIS — Z79899 Other long term (current) drug therapy: Secondary | ICD-10-CM

## 2020-10-02 DIAGNOSIS — Z7252 High risk homosexual behavior: Secondary | ICD-10-CM | POA: Diagnosis not present

## 2020-10-02 DIAGNOSIS — Z5181 Encounter for therapeutic drug level monitoring: Secondary | ICD-10-CM | POA: Diagnosis not present

## 2020-10-02 NOTE — Progress Notes (Signed)
Telephone visit  Subjective: CC:f/u PrEP PCP: Raliegh Ip, DO GUR:[id number] Prats is a 29 y.o. male calls for telephone consult today. Patient provides verbal consent for consult held via phone.  Due to COVID-19 pandemic this visit was conducted virtually. This visit type was conducted due to national recommendations for restrictions regarding the COVID-19 Pandemic (e.g. social distancing, sheltering in place) in an effort to limit this patient's exposure and mitigate transmission in our community. All issues noted in this document were discussed and addressed.  A physical exam was not performed with this format.   Location of patient: home Location of provider: [facility] Others present for call: none  1. PrEP Compliant with Descovy. No missed doses.  He has an alarm that he is set that he remembers the medicine each time.  Denies any sore throat, unplanned weight loss, night sweats, body aches, penile discharge or pain.  No new rashes.  ROS: Per HPI  No Known Allergies Past Medical History:  Diagnosis Date  . Hypertension   . Syphilis    treated at HD    Current Outpatient Medications:  .  emtricitabine-tenofovir AF (DESCOVY) 200-25 MG tablet, Take 1 tablet by mouth daily., Disp: 30 tablet, Rfl: 0 .  valACYclovir (VALTREX) 1000 MG tablet, Take 1 tablet (1,000 mg total) by mouth daily., Disp: 90 tablet, Rfl: 3  Assessment/ Plan: 29 y.o. male   1. High risk homosexual behavior Doing well with medications.  No adverse side effects.  No missed days.  He has come in for labs already today and therefore I will put the orders in.  We will plan to extend his prep meds out pending his results.  We will follow-up in 3 months, sooner if needed - HIV antibody (with reflex)  2. Medication monitoring encounter - HIV antibody (with reflex) - Basic Metabolic Panel  3. On pre-exposure prophylaxis for HIV - HIV antibody (with reflex) - Basic Metabolic Panel   Start time: 2:55pm End  time: 3:00pm  Total time spent on patient care (including telephone call/ virtual visit): 10 minutes  Fujiko Picazo Hulen Skains, DO [facility] [phone]

## 2020-10-03 LAB — BASIC METABOLIC PANEL
BUN/Creatinine Ratio: 13 (ref 9–20)
BUN: 11 mg/dL (ref 6–20)
CO2: 26 mmol/L (ref 20–29)
Calcium: 9.6 mg/dL (ref 8.7–10.2)
Chloride: 100 mmol/L (ref 96–106)
Creatinine, Ser: 0.88 mg/dL (ref 0.76–1.27)
GFR calc Af Amer: 136 mL/min/{1.73_m2} (ref >59)
GFR calc non Af Amer: 118 mL/min/{1.73_m2} (ref >59)
Glucose: 77 mg/dL (ref 65–99)
Potassium: 3.8 mmol/L (ref 3.5–5.2)
Sodium: 138 mmol/L (ref 134–144)

## 2020-10-03 LAB — HIV ANTIBODY (ROUTINE TESTING W REFLEX): HIV Screen 4th Generation wRfx: NONREACTIVE

## 2020-10-05 ENCOUNTER — Other Ambulatory Visit: Payer: Self-pay | Admitting: Family Medicine

## 2020-10-05 DIAGNOSIS — K5732 Diverticulitis of large intestine without perforation or abscess without bleeding: Secondary | ICD-10-CM | POA: Diagnosis not present

## 2020-10-05 DIAGNOSIS — Z7252 High risk homosexual behavior: Secondary | ICD-10-CM

## 2020-10-05 DIAGNOSIS — R1031 Right lower quadrant pain: Secondary | ICD-10-CM | POA: Diagnosis not present

## 2020-10-05 MED ORDER — DESCOVY 200-25 MG PO TABS: 1.0000 | ORAL_TABLET | Freq: Every day | ORAL | 2 refills | Status: DC

## 2020-10-08 ENCOUNTER — Telehealth: Payer: Self-pay

## 2020-10-08 NOTE — Telephone Encounter (Signed)
Discharged with  acetaminophen-codeine (TYLENOL #3) 300-30 mg per tablet         ciprofloxacin HCl (CIPRO) 500 MG tablet   Indications: Acute diverticulitis       metroNIDAZOLE (FLAGYL) 500 MG tablet   Indications: Acute diverticulitis       ondansetron (ZOFRAN-ODT) 4 MG disintegrating tablet

## 2020-10-08 NOTE — Telephone Encounter (Signed)
Yes. If he cannot tolerate meds and cannot keep fluid down, ED

## 2020-10-08 NOTE — Telephone Encounter (Signed)
These are the medications of choice for diverticulitis in the outpatient setting.  He otherwise will need admission for IV antibiotics.  Of note, Tylenol with codeine is notorious to cause nausea and vomiting.  He may consider omitting this medicine and simply taking the antibiotics and nausea medicine that he is prescribed.

## 2020-10-08 NOTE — Telephone Encounter (Signed)
Patient states that he never filled the Tylenol #3 and has only been taking the antibiotics and nausea med. If he can't tolerate should he return to the ER for an admission?

## 2020-10-09 NOTE — Telephone Encounter (Signed)
Patient aware and verbalizes understanding. 

## 2021-01-01 ENCOUNTER — Other Ambulatory Visit: Payer: Self-pay | Admitting: Family Medicine

## 2021-01-01 ENCOUNTER — Other Ambulatory Visit: Payer: Self-pay

## 2021-01-01 DIAGNOSIS — Z7252 High risk homosexual behavior: Secondary | ICD-10-CM

## 2021-01-01 NOTE — Telephone Encounter (Signed)
Pt aware refill sent to pharmacy 

## 2021-01-01 NOTE — Telephone Encounter (Signed)
  Prescription Request  01/01/2021  What is the name of the medication or equipment? Descovy  Have you contacted your pharmacy to request a refill? (if applicable) yes  Which pharmacy would you like this sent to? [facility]   Patient notified that their request is being sent to the clinical staff for review and that they should receive a response within 2 business days.   [facility] pt.  Pt has an appt on 01-11-2021 w/[facility].  Please call pt.

## 2021-01-05 ENCOUNTER — Ambulatory Visit: Payer: Medicaid Other | Admitting: Family Medicine

## 2021-01-11 ENCOUNTER — Ambulatory Visit: Payer: Medicaid Other | Admitting: Family Medicine

## 2021-01-18 ENCOUNTER — Ambulatory Visit (INDEPENDENT_AMBULATORY_CARE_PROVIDER_SITE_OTHER): Payer: Medicaid Other | Admitting: Family Medicine

## 2021-01-18 ENCOUNTER — Other Ambulatory Visit (HOSPITAL_COMMUNITY)
Admission: RE | Admit: 2021-01-18 | Discharge: 2021-01-18 | Disposition: A | Discharge status: Home / Self Care | Payer: Medicaid Other | Source: Ambulatory Visit | Attending: Family Medicine | Admitting: Family Medicine

## 2021-01-18 ENCOUNTER — Other Ambulatory Visit: Payer: Self-pay

## 2021-01-18 ENCOUNTER — Encounter: Payer: Self-pay | Admitting: Family Medicine

## 2021-01-18 VITALS — BP 133/88 | HR 82 | Temp 98.3°F | Ht 70.0 in | Wt 290.0 lb

## 2021-01-18 DIAGNOSIS — Z5181 Encounter for therapeutic drug level monitoring: Secondary | ICD-10-CM

## 2021-01-18 DIAGNOSIS — Z7252 High risk homosexual behavior: Secondary | ICD-10-CM | POA: Insufficient documentation

## 2021-01-18 DIAGNOSIS — Z79899 Other long term (current) drug therapy: Secondary | ICD-10-CM | POA: Diagnosis not present

## 2021-01-18 MED ORDER — DESCOVY 200-25 MG PO TABS: 1.0000 | ORAL_TABLET | Freq: Every day | ORAL | 0 refills | Status: DC

## 2021-01-18 NOTE — Patient Instructions (Signed)

## 2021-01-18 NOTE — Progress Notes (Signed)
Subjective: RF:FMBW PCP: Janora Norlander, DO GYK:[id number] Brallier is a 30 y.o. male presenting to clinic today for:  1. PrEP/ high risk sexual behavior Patient has had 1 unprotected intercourse since our last visit.  He has been totally compliant with Descovy.  No missed doses whatsoever.  No concerning side effects of the medication.  No genital concerns.  Denies any sore throat, unplanned weight loss, night sweats, rashes, rectal bleeding, nausea or vomiting.   ROS: Per HPI  No Known Allergies Past Medical History:  Diagnosis Date  . Hypertension   . Syphilis    treated at HD    Current Outpatient Medications:  .  DESCOVY 200-25 MG tablet, Take 1 tablet by mouth once daily, Disp: 30 tablet, Rfl: 0 .  valACYclovir (VALTREX) 1000 MG tablet, Take 1 tablet (1,000 mg total) by mouth daily., Disp: 90 tablet, Rfl: 3 Social History   Socioeconomic History  . Marital status: Single    Spouse name: Not on file  . Number of children: Not on file  . Years of education: Not on file  . Highest education level: Not on file  Occupational History  . Not on file  Tobacco Use  . Smoking status: Current Every Day Smoker    Packs/day: 0.50    Types: Cigarettes  . Smokeless tobacco: Current User  Vaping Use  . Vaping Use: Never used  Substance and Sexual Activity  . Alcohol use: No  . Drug use: No  . Sexual activity: Yes    Comment: bisexual  Other Topics Concern  . Not on file  Social History Narrative  . Not on file   Social Determinants of Health   Financial Resource Strain: Not on file  Food Insecurity: Not on file  Transportation Needs: Not on file  Physical Activity: Not on file  Stress: Not on file  Social Connections: Not on file  Intimate Partner Violence: Not on file   Family History  Problem Relation Age of Onset  . Diabetes Mother   . Hypertension Mother   . Hypertension Paternal Grandmother   . Diabetes Paternal Grandmother     Objective: Office  vital signs reviewed. BP 133/88   Pulse 82   Temp 98.3 F (36.8 C) (Temporal)   Ht $R'5\' 10"'dy$  (1.778 m)   Wt 290 lb (131.5 kg)   SpO2 97%   BMI 41.61 kg/m   Physical Examination:  General: Awake, alert, well nourished, No acute distress HEENT: Sclera white.  Oropharynx without erythema or lesions GI: soft, non-tender, obese GU: No urethral or genital lesions appreciated.  Assessment/ Plan: 30 y.o. male   On pre-exposure prophylaxis for HIV - Plan: HIV antibody (with reflex), CMP14+EGFR, RPR, Cytology (oral, anal, urethral) ancillary only, Cytology (oral, anal, urethral) ancillary only, Cytology (oral, anal, urethral) ancillary only  High risk homosexual behavior - Plan: HIV antibody (with reflex), CMP14+EGFR, RPR, Cytology (oral, anal, urethral) ancillary only, Cytology (oral, anal, urethral) ancillary only, Cytology (oral, anal, urethral) ancillary only, emtricitabine-tenofovir AF (DESCOVY) 200-25 MG tablet  Medication monitoring encounter - Plan: HIV antibody (with reflex), CMP14+EGFR, RPR  Doing well, stable on PrEP.  No missed doses. Check HIV, RPR, Renal function, GC and C/T Follow-up in 10 to 12 weeks for repeat labs and refills.  Orders Placed This Encounter  Procedures  . HIV antibody (with reflex)  . CMP14+EGFR  . RPR   No orders of the defined types were placed in this encounter.   Janora Norlander, DO [facility] [phone]

## 2021-01-19 ENCOUNTER — Telehealth: Payer: Self-pay

## 2021-01-19 ENCOUNTER — Other Ambulatory Visit: Payer: Self-pay | Admitting: Family Medicine

## 2021-01-19 DIAGNOSIS — A749 Chlamydial infection, unspecified: Secondary | ICD-10-CM

## 2021-01-19 LAB — CYTOLOGY, (ORAL, ANAL, URETHRAL) ANCILLARY ONLY
Chlamydia: NEGATIVE
Chlamydia: NEGATIVE
Chlamydia: POSITIVE — AB
Comment: NEGATIVE
Comment: NEGATIVE
Comment: NEGATIVE
Comment: NEGATIVE
Comment: NEGATIVE
Comment: NEGATIVE
Comment: NORMAL
Comment: NORMAL
Comment: NORMAL
Neisseria Gonorrhea: NEGATIVE
Neisseria Gonorrhea: NEGATIVE
Neisseria Gonorrhea: NEGATIVE
Trichomonas: NEGATIVE
Trichomonas: NEGATIVE
Trichomonas: NEGATIVE

## 2021-01-19 LAB — CMP14+EGFR
ALT: 40 IU/L (ref 0–44)
AST: 29 IU/L (ref 0–40)
Albumin/Globulin Ratio: 1.5 (ref 1.2–2.2)
Albumin: 4.6 g/dL (ref 4.1–5.2)
Alkaline Phosphatase: 95 IU/L (ref 44–121)
BUN/Creatinine Ratio: 9 (ref 9–20)
BUN: 8 mg/dL (ref 6–20)
Bilirubin Total: 0.3 mg/dL (ref 0.0–1.2)
CO2: 23 mmol/L (ref 20–29)
Calcium: 9.7 mg/dL (ref 8.7–10.2)
Chloride: 100 mmol/L (ref 96–106)
Creatinine, Ser: 0.93 mg/dL (ref 0.76–1.27)
GFR calc Af Amer: 129 mL/min/{1.73_m2} (ref >59)
GFR calc non Af Amer: 111 mL/min/{1.73_m2} (ref >59)
Globulin, Total: 3.1 g/dL (ref 1.5–4.5)
Glucose: 81 mg/dL (ref 65–99)
Potassium: 4.2 mmol/L (ref 3.5–5.2)
Sodium: 138 mmol/L (ref 134–144)
Total Protein: 7.7 g/dL (ref 6.0–8.5)

## 2021-01-19 LAB — RPR, QUANT+TP ABS (REFLEX)
Rapid Plasma Reagin, Quant: 1:2 {titer} — ABNORMAL HIGH
T Pallidum Abs: REACTIVE — AB

## 2021-01-19 LAB — HIV ANTIBODY (ROUTINE TESTING W REFLEX): HIV Screen 4th Generation wRfx: NONREACTIVE

## 2021-01-19 LAB — RPR: RPR Ser Ql: REACTIVE — AB

## 2021-01-19 MED ORDER — AZITHROMYCIN 500 MG PO TABS: 1000.0000 mg | ORAL_TABLET | Freq: Every day | ORAL | 0 refills | Status: DC

## 2021-01-19 MED ORDER — CEFTRIAXONE SODIUM 1 G IJ SOLR
1.0000 g | Freq: Once | INTRAMUSCULAR | Status: AC
  Administered 2021-01-20: 1 g via INTRAMUSCULAR

## 2021-01-19 NOTE — Telephone Encounter (Signed)
Patient calling to confirm that he is not to take the antibiotic today.  Informed patient he is to bring the oral antibiotic with him to appointment with triage nurse tomorrow and he will take it then in front of her along with having a Rocephin injection.

## 2021-01-20 ENCOUNTER — Ambulatory Visit (INDEPENDENT_AMBULATORY_CARE_PROVIDER_SITE_OTHER): Payer: Medicaid Other | Admitting: *Deleted

## 2021-01-20 ENCOUNTER — Other Ambulatory Visit: Payer: Self-pay

## 2021-01-20 DIAGNOSIS — A749 Chlamydial infection, unspecified: Secondary | ICD-10-CM | POA: Diagnosis not present

## 2021-01-20 NOTE — Progress Notes (Signed)
Pt tolerated Rocephin shot

## 2021-01-29 ENCOUNTER — Other Ambulatory Visit: Payer: Self-pay | Admitting: Family Medicine

## 2021-01-29 DIAGNOSIS — Z7252 High risk homosexual behavior: Secondary | ICD-10-CM

## 2021-03-05 DIAGNOSIS — J208 Acute bronchitis due to other specified organisms: Secondary | ICD-10-CM | POA: Diagnosis not present

## 2021-03-05 DIAGNOSIS — R509 Fever, unspecified: Secondary | ICD-10-CM | POA: Diagnosis not present

## 2021-03-05 DIAGNOSIS — J40 Bronchitis, not specified as acute or chronic: Secondary | ICD-10-CM | POA: Diagnosis not present

## 2021-03-05 DIAGNOSIS — R059 Cough, unspecified: Secondary | ICD-10-CM | POA: Diagnosis not present

## 2021-03-05 DIAGNOSIS — Z20822 Contact with and (suspected) exposure to covid-19: Secondary | ICD-10-CM | POA: Diagnosis not present

## 2021-03-30 ENCOUNTER — Other Ambulatory Visit (HOSPITAL_COMMUNITY)
Admission: RE | Admit: 2021-03-30 | Discharge: 2021-03-30 | Disposition: A | Discharge status: Home / Self Care | Payer: Medicaid Other | Source: Ambulatory Visit | Attending: Family Medicine | Admitting: Family Medicine

## 2021-03-30 ENCOUNTER — Ambulatory Visit (INDEPENDENT_AMBULATORY_CARE_PROVIDER_SITE_OTHER): Payer: Medicaid Other | Admitting: Family Medicine

## 2021-03-30 ENCOUNTER — Other Ambulatory Visit: Payer: Self-pay

## 2021-03-30 ENCOUNTER — Encounter: Payer: Self-pay | Admitting: Family Medicine

## 2021-03-30 VITALS — BP 133/83 | HR 76 | Temp 97.2°F | Resp 20 | Ht 70.0 in | Wt 294.0 lb

## 2021-03-30 DIAGNOSIS — Z8619 Personal history of other infectious and parasitic diseases: Secondary | ICD-10-CM | POA: Insufficient documentation

## 2021-03-30 DIAGNOSIS — G479 Sleep disorder, unspecified: Secondary | ICD-10-CM

## 2021-03-30 DIAGNOSIS — Z7252 High risk homosexual behavior: Secondary | ICD-10-CM

## 2021-03-30 DIAGNOSIS — Z79899 Other long term (current) drug therapy: Secondary | ICD-10-CM | POA: Diagnosis not present

## 2021-03-30 NOTE — Progress Notes (Signed)
Subjective: CC: PrEP PCP: Raliegh Ip, DO GYF:[id number] Gerald [name] is a 30 y.o. male presenting to clinic today for:  1.  Preexposure prophylaxis for HIV; history of syphilis, herpes and chlamydia Patient has missed 1 or 2 doses of his Descovy since our last visit in January.  He did have bronchitis a few weeks ago but that has been successfully treated.  He has been afebrile.  He denies any sore throat, abdominal pain, urethral or rectal discharge.  No rectal bleeding.  No sore throat, unplanned weight loss, night sweats.  He has had unprotected sex x3 with 1 partner.  He has not engaged in sexual activity with the partner that he suspects gave him chlamydia.  He is status post treatment for chlamydia.  He does have a history of syphilis and test positive for this but both of his genital urinary infections have been treated.  He is also positive for herpes and is on suppressive therapy with Valtrex.  2.  Sleep difficulties Patient reports that he has difficulty with sleep.  He alternates between being able to sleep normally and being a night owl.  He feels that this is related to when he lived with his grandmother and they would stay up all night.  He admits to drinking 2 caffeinated beverages per day and often watching TV or being on his telephone in the evening time.  ROS: Per HPI  No Known Allergies Past Medical History:  Diagnosis Date  . Hypertension   . Syphilis    treated at HD    Current Outpatient Medications:  .  azithromycin (ZITHROMAX) 500 MG tablet, Take 2 tablets (1,000 mg total) by mouth daily., Disp: 2 tablet, Rfl: 0 .  emtricitabine-tenofovir AF (DESCOVY) 200-25 MG tablet, Take 1 tablet by mouth daily., Disp: 90 tablet, Rfl: 0 .  valACYclovir (VALTREX) 1000 MG tablet, Take 1 tablet (1,000 mg total) by mouth daily., Disp: 90 tablet, Rfl: 3 Social History   Socioeconomic History  . Marital status: Single    Spouse name: Not on file  . Number of children: Not on  file  . Years of education: Not on file  . Highest education level: Not on file  Occupational History  . Not on file  Tobacco Use  . Smoking status: Current Every Day Smoker    Packs/day: 0.50    Types: Cigarettes  . Smokeless tobacco: Current User  Vaping Use  . Vaping Use: Never used  Substance and Sexual Activity  . Alcohol use: No  . Drug use: No  . Sexual activity: Yes    Birth control/protection: Condom    Comment: gay  Other Topics Concern  . Not on file  Social History Narrative  . Not on file   Social Determinants of Health   Financial Resource Strain: Not on file  Food Insecurity: Not on file  Transportation Needs: Not on file  Physical Activity: Not on file  Stress: Not on file  Social Connections: Not on file  Intimate Partner Violence: Not on file   Family History  Problem Relation Age of Onset  . Diabetes Mother   . Hypertension Mother   . Hypertension Paternal Grandmother   . Diabetes Paternal Grandmother     Objective: Office vital signs reviewed. BP 133/83   Pulse 76   Temp (!) 97.2 F (36.2 C)   Resp 20   Ht 5\' 10"  (1.778 m)   Wt 294 lb (133.4 kg)   SpO2 96%   BMI 42.18  kg/m   Physical Examination:  General: Awake, alert, well appearing obese male, No acute distress HEENT: Normal, sclera white.  Oropharynx clear with no appreciable erythema or masses GU: No scrotal swelling, penile discharge, ulcerations appreciated. Rectum: No appreciable bleeding, tears or discharge  Assessment/ Plan: 30 y.o. male   On pre-exposure prophylaxis for HIV - Plan: HIV antibody (with reflex), Basic Metabolic Panel  High risk homosexual behavior - Plan: STD Screen (8), Cytology (oral, anal, urethral) ancillary only, Cytology (oral, anal, urethral) ancillary only, Cytology (oral, anal, urethral) ancillary only, HIV antibody (with reflex)  History of chlamydia infection - Plan: Cytology (oral, anal, urethral) ancillary only, Cytology (oral, anal, urethral)  ancillary only, Cytology (oral, anal, urethral) ancillary only  History of syphilis - Plan: Cytology (oral, anal, urethral) ancillary only, Cytology (oral, anal, urethral) ancillary only, Cytology (oral, anal, urethral) ancillary only  Sleep difficulties  Check HIV, BMP, STD screen, GC, chlamydia and trichomonas.  Oropharyngeal, rectal and urethral swabs performed.  Will extend medication out for 3 months pending lab results.  Reinforced safe sex practices including use of condom.  Would not rely on his partners word alone.  This was reiterated during her visit today  Sounds like he is having habitual sleep difficulties.  Restrict caffeine, sugar and bluelight.  Handout provided.  We will gladly refer to sleep studies if needed going forward.  Could consider melatonin up to 5 mg nightly if needed  No orders of the defined types were placed in this encounter.  No orders of the defined types were placed in this encounter.    Raliegh Ip, DO [facility] [phone]

## 2021-03-30 NOTE — Patient Instructions (Signed)

## 2021-03-31 ENCOUNTER — Other Ambulatory Visit: Payer: Self-pay | Admitting: Family Medicine

## 2021-03-31 DIAGNOSIS — Z7252 High risk homosexual behavior: Secondary | ICD-10-CM

## 2021-03-31 MED ORDER — DESCOVY 200-25 MG PO TABS: 1.0000 | ORAL_TABLET | Freq: Every day | ORAL | 0 refills | Status: DC

## 2021-04-01 LAB — CYTOLOGY, (ORAL, ANAL, URETHRAL) ANCILLARY ONLY
Chlamydia: NEGATIVE
Chlamydia: NEGATIVE
Chlamydia: NEGATIVE
Comment: NEGATIVE
Comment: NEGATIVE
Comment: NEGATIVE
Comment: NEGATIVE
Comment: NEGATIVE
Comment: NEGATIVE
Comment: NORMAL
Comment: NORMAL
Comment: NORMAL
Neisseria Gonorrhea: NEGATIVE
Neisseria Gonorrhea: NEGATIVE
Neisseria Gonorrhea: NEGATIVE
Trichomonas: NEGATIVE
Trichomonas: NEGATIVE
Trichomonas: NEGATIVE

## 2021-04-01 LAB — STD SCREEN (8)
HIV Screen 4th Generation wRfx: NONREACTIVE
HSV 1 Glycoprotein G Ab, IgG: 4.16 index — ABNORMAL HIGH (ref 0.00–0.90)
HSV 2 IgG, Type Spec: 2.43 index — ABNORMAL HIGH (ref 0.00–0.90)
Hep A IgM: NEGATIVE
Hep B C IgM: NEGATIVE
Hep C Virus Ab: <0.1 s/co ratio (ref 0.0–0.9)
Hepatitis B Surface Ag: NEGATIVE
RPR Ser Ql: REACTIVE — AB

## 2021-04-01 LAB — BASIC METABOLIC PANEL
BUN/Creatinine Ratio: 11 (ref 9–20)
BUN: 9 mg/dL (ref 6–20)
CO2: 21 mmol/L (ref 20–29)
Calcium: 9.3 mg/dL (ref 8.7–10.2)
Chloride: 99 mmol/L (ref 96–106)
Creatinine, Ser: 0.8 mg/dL (ref 0.76–1.27)
Glucose: 89 mg/dL (ref 65–99)
Potassium: 3.9 mmol/L (ref 3.5–5.2)
Sodium: 136 mmol/L (ref 134–144)
eGFR: 124 mL/min/{1.73_m2} (ref >59)

## 2021-04-01 LAB — RPR, QUANT. (REFLEX): Rapid Plasma Reagin, Quant: 1:2 {titer} — ABNORMAL HIGH

## 2021-04-01 LAB — HSV-2 IGG SUPPLEMENTAL TEST: HSV-2 IgG Supplemental Test: POSITIVE — AB

## 2021-06-01 ENCOUNTER — Encounter: Payer: Self-pay | Admitting: *Deleted

## 2021-06-04 ENCOUNTER — Ambulatory Visit: Payer: Medicaid Other | Admitting: Family Medicine

## 2021-06-15 ENCOUNTER — Telehealth: Payer: Self-pay | Admitting: Family Medicine

## 2021-06-15 NOTE — Telephone Encounter (Signed)
Patient did not take his medication yesterday until about midnight and wanted to make sure it was okay to go ahead and take medication today.  I advised patient to wait until this afternoon to take today's medication and get back on regular schedule tomorrow.

## 2021-06-25 ENCOUNTER — Encounter: Payer: Self-pay | Admitting: Family Medicine

## 2021-06-25 ENCOUNTER — Other Ambulatory Visit (HOSPITAL_COMMUNITY)
Admission: RE | Admit: 2021-06-25 | Discharge: 2021-06-25 | Disposition: A | Discharge status: Home / Self Care | Payer: Medicaid Other | Source: Ambulatory Visit | Attending: Family Medicine | Admitting: Family Medicine

## 2021-06-25 ENCOUNTER — Ambulatory Visit: Payer: Medicaid Other | Admitting: Family Medicine

## 2021-06-25 ENCOUNTER — Other Ambulatory Visit: Payer: Self-pay

## 2021-06-25 VITALS — BP 127/79 | HR 82 | Temp 97.8°F | Ht 70.0 in | Wt 290.6 lb

## 2021-06-25 DIAGNOSIS — B009 Herpesviral infection, unspecified: Secondary | ICD-10-CM | POA: Diagnosis not present

## 2021-06-25 DIAGNOSIS — Z79899 Other long term (current) drug therapy: Secondary | ICD-10-CM | POA: Diagnosis not present

## 2021-06-25 DIAGNOSIS — Z7252 High risk homosexual behavior: Secondary | ICD-10-CM | POA: Insufficient documentation

## 2021-06-25 DIAGNOSIS — Z8619 Personal history of other infectious and parasitic diseases: Secondary | ICD-10-CM | POA: Insufficient documentation

## 2021-06-25 MED ORDER — VALACYCLOVIR HCL 1 G PO TABS: 1000.0000 mg | ORAL_TABLET | Freq: Every day | ORAL | 3 refills | Status: DC

## 2021-06-25 MED ORDER — DESCOVY 200-25 MG PO TABS: 1.0000 | ORAL_TABLET | Freq: Every day | ORAL | 0 refills | Status: DC

## 2021-06-25 NOTE — Progress Notes (Signed)
Subjective: IZ:TIWP follow-up PCP: Raliegh Ip, DO [id number] Gerald [name] is a 30 y.o. male presenting to clinic today for:  1.  High risk sexual activity, h/o Syphilis; HSV2 Patient reports that he is only engaged in intercourse x1 since her last visit and this was protected.  He denies any difficulty swallowing, sore throat, new rashes, night sweats, fevers, penile discharge, dysuria, rectal discharge or bleeding.  He has been compliant with his medications.  Would like to get a 23-month supply of the Valtrex   ROS: Per HPI  No Known Allergies Past Medical History:  Diagnosis Date   Hypertension    Syphilis    treated at HD    Current Outpatient Medications:    emtricitabine-tenofovir AF (DESCOVY) 200-25 MG tablet, Take 1 tablet by mouth daily., Disp: 90 tablet, Rfl: 0   valACYclovir (VALTREX) 1000 MG tablet, Take 1 tablet (1,000 mg total) by mouth daily., Disp: 90 tablet, Rfl: 3 Social History   Socioeconomic History   Marital status: Single    Spouse name: Not on file   Number of children: Not on file   Years of education: Not on file   Highest education level: Not on file  Occupational History   Not on file  Tobacco Use   Smoking status: Every Day    Packs/day: 0.50    Pack years: 0.00    Types: Cigarettes   Smokeless tobacco: Current  Vaping Use   Vaping Use: Never used  Substance and Sexual Activity   Alcohol use: No   Drug use: No   Sexual activity: Yes    Birth control/protection: Condom    Comment: gay  Other Topics Concern   Not on file  Social History Narrative   Not on file   Social Determinants of Health   Financial Resource Strain: Not on file  Food Insecurity: Not on file  Transportation Needs: Not on file  Physical Activity: Not on file  Stress: Not on file  Social Connections: Not on file  Intimate Partner Violence: Not on file   Family History  Problem Relation Age of Onset   Diabetes Mother    Hypertension Mother     Hypertension Paternal Grandmother    Diabetes Paternal Grandmother     Objective: Office vital signs reviewed. BP 127/79   Pulse 82   Temp 97.8 F (36.6 C)   Ht 5\' 10"  (1.778 m)   Wt 290 lb 9.6 oz (131.8 kg)   SpO2 95%   BMI 41.70 kg/m   Physical Examination:  General: Awake, alert, well nourished, No acute distress HEENT: Normal, sclera white, MMM; oropharynx without lesions.  No lymphadenopathy Cardio: regular rate and rhythm, S1S2 heard, no murmurs appreciated Pulm: clear to auscultation bilaterally, no wheezes, rhonchi or rales; normal work of breathing on room air GU: No apparent penile lesions, discharge.  No rectal lesions or discharge.  Assessment/ Plan: 30 y.o. male   High risk homosexual behavior - Plan: HIV antibody (with reflex), RPR, Acute Hep Panel & Hep B Surface Ab, HSV I/II IgM Rflx I/II Type Sp, Basic Metabolic Panel, Cytology (oral, anal, urethral) ancillary only, Cytology (oral, anal, urethral) ancillary only, Cytology (oral, anal, urethral) ancillary only, emtricitabine-tenofovir AF (DESCOVY) 200-25 MG tablet, CANCELED: Cytology (oral, anal, urethral) ancillary only, CANCELED: Cytology (oral, anal, urethral) ancillary only, CANCELED: Cytology (oral, anal, urethral) ancillary only  On pre-exposure prophylaxis for HIV - Plan: HIV antibody (with reflex), RPR, Acute Hep Panel & Hep B Surface Ab, HSV I/II IgM  Rflx I/II Type Sp, Basic Metabolic Panel, Cytology (oral, anal, urethral) ancillary only, Cytology (oral, anal, urethral) ancillary only, Cytology (oral, anal, urethral) ancillary only, CANCELED: Cytology (oral, anal, urethral) ancillary only, CANCELED: Cytology (oral, anal, urethral) ancillary only, CANCELED: Cytology (oral, anal, urethral) ancillary only  History of chlamydia infection - Plan: HIV antibody (with reflex), RPR, Acute Hep Panel & Hep B Surface Ab, HSV I/II IgM Rflx I/II Type Sp, Basic Metabolic Panel, Cytology (oral, anal, urethral) ancillary only,  Cytology (oral, anal, urethral) ancillary only, Cytology (oral, anal, urethral) ancillary only, CANCELED: Cytology (oral, anal, urethral) ancillary only, CANCELED: Cytology (oral, anal, urethral) ancillary only, CANCELED: Cytology (oral, anal, urethral) ancillary only  HSV-2 infection - Plan: valACYclovir (VALTREX) 1000 MG tablet  Routine 41-month screen for STDs, HIV and renal function check.  Continue PreP.  Reinforced safe sex practices.  Valtrex renewed.  No active herpes lesions  Follow-up in 3 months   Orders Placed This Encounter  Procedures   HIV antibody (with reflex)   RPR   Acute Hep Panel & Hep B Surface Ab   HSV I/II IgM Rflx I/II Type Sp   Basic Metabolic Panel   No orders of the defined types were placed in this encounter.    Raliegh Ip, DO [facility] [phone]

## 2021-06-29 ENCOUNTER — Telehealth: Payer: Self-pay

## 2021-06-29 NOTE — Telephone Encounter (Signed)
Transition Care Management Follow-up Telephone Call Date of discharge and from where: 06/26/2021 from [facility] How have you been since you were released from the hospital? Pt stated that he is feeling well and has no questions or concerns at this time.  Any questions or concerns? No  Items Reviewed: Did the pt receive and understand the discharge instructions provided? Yes  Medications obtained and verified? Yes  Other? No  Any new allergies since your discharge? No  Dietary orders reviewed? No Do you have support at home? Yes   Functional Questionnaire: (I = Independent and D = Dependent) ADLs: I  Bathing/Dressing- I  Meal Prep- I  Eating- I  Maintaining continence- I  Transferring/Ambulation- I  Managing Meds- I   Follow up appointments reviewed:  PCP Hospital f/u appt confirmed? No   Specialist Hospital f/u appt confirmed? No   Are transportation arrangements needed? No  If their condition worsens, is the pt aware to call PCP or go to the Emergency Dept.? Yes Was the patient provided with contact information for the PCP's office or ED? Yes Was to pt encouraged to call back with questions or concerns? Yes

## 2021-06-30 LAB — ACUTE HEP PANEL AND HEP B SURFACE AB
Hep A IgM: NEGATIVE
Hep B C IgM: NEGATIVE
Hep C Virus Ab: <0.1 s/co ratio (ref 0.0–0.9)
Hepatitis B Surf Ab Quant: 256.9 m[IU]/mL (ref >9.9)
Hepatitis B Surface Ag: NEGATIVE

## 2021-06-30 LAB — BASIC METABOLIC PANEL
BUN/Creatinine Ratio: 8 — ABNORMAL LOW (ref 9–20)
BUN: 7 mg/dL (ref 6–20)
CO2: 23 mmol/L (ref 20–29)
Calcium: 9.8 mg/dL (ref 8.7–10.2)
Chloride: 102 mmol/L (ref 96–106)
Creatinine, Ser: 0.92 mg/dL (ref 0.76–1.27)
Glucose: 88 mg/dL (ref 65–99)
Potassium: 4.1 mmol/L (ref 3.5–5.2)
Sodium: 139 mmol/L (ref 134–144)
eGFR: 116 mL/min/{1.73_m2} (ref >59)

## 2021-06-30 LAB — CYTOLOGY, (ORAL, ANAL, URETHRAL) ANCILLARY ONLY
Chlamydia: NEGATIVE
Chlamydia: NEGATIVE
Chlamydia: NEGATIVE
Comment: NEGATIVE
Comment: NEGATIVE
Comment: NEGATIVE
Comment: NEGATIVE
Comment: NEGATIVE
Comment: NEGATIVE
Comment: NORMAL
Comment: NORMAL
Comment: NORMAL
Neisseria Gonorrhea: NEGATIVE
Neisseria Gonorrhea: NEGATIVE
Neisseria Gonorrhea: NEGATIVE
Trichomonas: NEGATIVE
Trichomonas: NEGATIVE
Trichomonas: NEGATIVE

## 2021-06-30 LAB — RPR, QUANT+TP ABS (REFLEX)
Rapid Plasma Reagin, Quant: 1:1 {titer} — ABNORMAL HIGH
T Pallidum Abs: REACTIVE — AB

## 2021-06-30 LAB — HIV ANTIBODY (ROUTINE TESTING W REFLEX): HIV Screen 4th Generation wRfx: NONREACTIVE

## 2021-06-30 LAB — HSV I/II IGM RFLX I/II TYPE SP: HSVI/II Comb IgM: <0.91 Ratio (ref 0.00–0.90)

## 2021-06-30 LAB — RPR: RPR Ser Ql: REACTIVE — AB

## 2021-07-02 ENCOUNTER — Ambulatory Visit: Payer: Medicaid Other | Admitting: Family Medicine

## 2021-09-08 DIAGNOSIS — U071 COVID-19: Secondary | ICD-10-CM | POA: Diagnosis not present

## 2021-09-10 ENCOUNTER — Telehealth: Payer: Self-pay

## 2021-09-10 NOTE — Telephone Encounter (Signed)
Transition Care Management Follow-up Telephone Call Date of discharge and from where: 09/09/2021-[facility]  How have you been since you were released from the hospital? Patient stated he is doing ok.  Any questions or concerns? No  Items Reviewed: Did the pt receive and understand the discharge instructions provided? Yes  Medications obtained and verified?  N/A Other? No  Any new allergies since your discharge? No  Dietary orders reviewed? N/A Do you have support at home? Yes   Home Care and Equipment/Supplies: Were home health services ordered? not applicable If so, what is the name of the agency? N/A  Has the agency set up a time to come to the patient's home? not applicable Were any new equipment or medical supplies ordered?  No What is the name of the medical supply agency? N/A Were you able to get the supplies/equipment? not applicable Do you have any questions related to the use of the equipment or supplies? No  Functional Questionnaire: (I = Independent and D = Dependent) ADLs: I  Bathing/Dressing- I  Meal Prep- I  Eating- I  Maintaining continence- I  Transferring/Ambulation- I  Managing Meds- I  Follow up appointments reviewed:  PCP Hospital f/u appt confirmed? No  Specialist Hospital f/u appt confirmed? No   Are transportation arrangements needed? No  If their condition worsens, is the pt aware to call PCP or go to the Emergency Dept.? Yes Was the patient provided with contact information for the PCP's office or ED? Yes Was to pt encouraged to call back with questions or concerns? Yes

## 2021-09-16 ENCOUNTER — Telehealth: Payer: Self-pay

## 2021-09-16 NOTE — Telephone Encounter (Signed)
Transition Care Management Follow-up Telephone Call Date of discharge and from where: 09/15/2021-[facility] How have you been since you were released from the hospital? Patient stated he is doing ok. Any questions or concerns? No  Items Reviewed: Did the pt receive and understand the discharge instructions provided? Yes  Medications obtained and verified? Yes  Other? No  Any new allergies since your discharge? No  Dietary orders reviewed? N/A Do you have support at home? Yes   Home Care and Equipment/Supplies: Were home health services ordered? not applicable If so, what is the name of the agency? N/A  Has the agency set up a time to come to the patient's home? not applicable Were any new equipment or medical supplies ordered?  No What is the name of the medical supply agency? N/A Were you able to get the supplies/equipment? not applicable Do you have any questions related to the use of the equipment or supplies? No  Functional Questionnaire: (I = Independent and D = Dependent) ADLs: I  Bathing/Dressing- I  Meal Prep- I  Eating- I  Maintaining continence- I  Transferring/Ambulation- I  Managing Meds- I  Follow up appointments reviewed:  PCP Hospital f/u appt confirmed? Yes  Scheduled to see DO. Gottschalk on 10/05/2021 @ 3pm. Specialist Hospital f/u appt confirmed? No   Are transportation arrangements needed? No  If their condition worsens, is the pt aware to call PCP or go to the Emergency Dept.? Yes Was the patient provided with contact information for the PCP's office or ED? Yes Was to pt encouraged to call back with questions or concerns? Yes

## 2021-09-17 DIAGNOSIS — Z20822 Contact with and (suspected) exposure to covid-19: Secondary | ICD-10-CM | POA: Diagnosis not present

## 2021-10-05 ENCOUNTER — Other Ambulatory Visit: Payer: Self-pay

## 2021-10-05 ENCOUNTER — Encounter: Payer: Self-pay | Admitting: Family Medicine

## 2021-10-05 ENCOUNTER — Other Ambulatory Visit (HOSPITAL_COMMUNITY)
Admission: RE | Admit: 2021-10-05 | Discharge: 2021-10-05 | Disposition: A | Discharge status: Home / Self Care | Payer: Medicaid Other | Source: Ambulatory Visit | Attending: Family Medicine | Admitting: Family Medicine

## 2021-10-05 ENCOUNTER — Ambulatory Visit: Payer: Medicaid Other | Admitting: Family Medicine

## 2021-10-05 VITALS — BP 166/105 | HR 109 | Temp 98.6°F | Ht 70.0 in | Wt 293.2 lb

## 2021-10-05 DIAGNOSIS — Z8619 Personal history of other infectious and parasitic diseases: Secondary | ICD-10-CM | POA: Insufficient documentation

## 2021-10-05 DIAGNOSIS — I10 Essential (primary) hypertension: Secondary | ICD-10-CM

## 2021-10-05 DIAGNOSIS — Z79899 Other long term (current) drug therapy: Secondary | ICD-10-CM

## 2021-10-05 DIAGNOSIS — Z7252 High risk homosexual behavior: Secondary | ICD-10-CM | POA: Insufficient documentation

## 2021-10-05 MED ORDER — AMLODIPINE BESYLATE 2.5 MG PO TABS
2.5000 mg | ORAL_TABLET | Freq: Every day | ORAL | 3 refills | Status: DC
Start: 2021-10-05 — End: 2022-09-20

## 2021-10-05 MED ORDER — DESCOVY 200-25 MG PO TABS: 1.0000 | ORAL_TABLET | Freq: Every day | ORAL | 0 refills | Status: DC

## 2021-10-05 NOTE — Patient Instructions (Signed)

## 2021-10-05 NOTE — Progress Notes (Signed)
Subjective: Gerald [name] PCP: Janora Norlander, DO OXB:[id number] Gerald [name] is a 30 y.o. male presenting to clinic today for:  1.  High risk homosexual behavior Patient reports engaging in 1 nonprotected sex encounter since our last visit.  He has been totally compliant with his Descovy.  Denies any sore throat, penile discharge, penile pain or lesions.  No fevers, unplanned weight loss, rashes.  No lymph node enlargement.  He denies any HSV breakouts and has been compliant with his Valtrex as well.  He reports having recently recovered from [place].  2.  Hypertension Patient previously treated with ACE inhibitor.  His blood pressure had remained normal for a while and he was trialed off of this.  Unfortunately, his blood pressure has been elevated on 3 different medical encounters and he would like to resume blood pressure medicine.  Denies any chest pain, shortness of breath, dizziness, swelling   ROS: Per HPI  No Known Allergies Past Medical History:  Diagnosis Date   Hypertension    Syphilis    treated at HD    Current Outpatient Medications:    emtricitabine-tenofovir AF (DESCOVY) 200-25 MG tablet, Take 1 tablet by mouth daily., Disp: 90 tablet, Rfl: 0   valACYclovir (VALTREX) 1000 MG tablet, Take 1 tablet (1,000 mg total) by mouth daily., Disp: 90 tablet, Rfl: 3 Social History   Socioeconomic History   Marital status: Single    Spouse name: Not on file   Number of children: Not on file   Years of education: Not on file   Highest education level: Not on file  Occupational History   Not on file  Tobacco Use   Smoking status: Every Day    Packs/day: 0.50    Types: Cigarettes   Smokeless tobacco: Current  Vaping Use   Vaping Use: Never used  Substance and Sexual Activity   Alcohol use: No   Drug use: No   Sexual activity: Yes    Birth control/protection: Condom    Comment: gay  Other Topics Concern   Not on file  Social History Narrative   Not on file   Social  Determinants of Health   Financial Resource Strain: Not on file  Food Insecurity: Not on file  Transportation Needs: Not on file  Physical Activity: Not on file  Stress: Not on file  Social Connections: Not on file  Intimate Partner Violence: Not on file   Family History  Problem Relation Age of Onset   Diabetes Mother    Hypertension Mother    Hypertension Paternal Grandmother    Diabetes Paternal Grandmother     Objective: Office vital signs reviewed. BP (!) 166/105   Pulse (!) 109   Temp 98.6 F (37 C)   Ht 5' 10"  (1.778 m)   Wt 293 lb 3.2 oz (133 kg)   SpO2 94%   BMI 42.07 kg/m   Physical Examination:  General: Awake, alert, well appearing male, No acute distress HEENT: No lymphadenopathy.  Oropharynx without erythema, exudates Cardio: regular rate and rhythm, S1S2 heard, no murmurs appreciated Pulm: clear to auscultation bilaterally, no wheezes, rhonchi or rales; normal work of breathing on room air GU: No penile lesions, discharge. No rectal lesions, bleeding or discharge noted.  Assessment/ Plan: 30 y.o. male   High risk homosexual behavior - Plan: Cytology (oral, anal, urethral) ancillary only, Cytology (oral, anal, urethral) ancillary only, Cytology (oral, anal, urethral) ancillary only, RPR, HepB+HepC+HIV Panel, CMP14+EGFR, emtricitabine-tenofovir AF (DESCOVY) 200-25 MG tablet, HSV(herpes simplex vrs) 1+2 ab-IgG,  CANCELED: Herpes simplex virus(hsv) dna by pcr  On pre-exposure prophylaxis for HIV - Plan: Cytology (oral, anal, urethral) ancillary only, Cytology (oral, anal, urethral) ancillary only, Cytology (oral, anal, urethral) ancillary only, RPR, HepB+HepC+HIV Panel, CMP14+EGFR, CANCELED: Herpes simplex virus(hsv) dna by pcr  History of chlamydia infection - Plan: Cytology (oral, anal, urethral) ancillary only, Cytology (oral, anal, urethral) ancillary only, Cytology (oral, anal, urethral) ancillary only, RPR, HepB+HepC+HIV Panel, CMP14+EGFR, CANCELED: Herpes  simplex virus(hsv) dna by pcr  Essential hypertension - Plan: amLODipine (NORVASC) 2.5 MG tablet  Repeat labs have been ordered.  We will continue PrEP for another 3 months pending results.  Reinforced safe sex practices.  Blood pressure not at goal.  Start Norvasc 2.5 mg  Orders Placed This Encounter  Procedures   RPR   HepB+HepC+HIV Panel   CMP14+EGFR   HSV(herpes simplex vrs) 1+2 ab-IgG   No orders of the defined types were placed in this encounter.    Janora Norlander, DO [facility] [phone]

## 2021-10-06 LAB — HSV(HERPES SIMPLEX VRS) I + II AB-IGG
HSV 1 Glycoprotein G Ab, IgG: 4.92 index — ABNORMAL HIGH (ref 0.00–0.90)
HSV 2 IgG, Type Spec: 3.01 index — ABNORMAL HIGH (ref 0.00–0.90)

## 2021-10-06 LAB — HSV-2 IGG SUPPLEMENTAL TEST: HSV-2 IgG Supplemental Test: POSITIVE — AB

## 2021-10-07 LAB — CMP14+EGFR
ALT: 42 IU/L (ref 0–44)
AST: 26 IU/L (ref 0–40)
Albumin/Globulin Ratio: 1.6 (ref 1.2–2.2)
Albumin: 4.4 g/dL (ref 4.1–5.2)
Alkaline Phosphatase: 89 IU/L (ref 44–121)
BUN/Creatinine Ratio: 14 (ref 9–20)
BUN: 11 mg/dL (ref 6–20)
Bilirubin Total: 0.2 mg/dL (ref 0.0–1.2)
CO2: 24 mmol/L (ref 20–29)
Calcium: 9.6 mg/dL (ref 8.7–10.2)
Chloride: 102 mmol/L (ref 96–106)
Creatinine, Ser: 0.81 mg/dL (ref 0.76–1.27)
Globulin, Total: 2.7 g/dL (ref 1.5–4.5)
Glucose: 94 mg/dL (ref 70–99)
Potassium: 3.9 mmol/L (ref 3.5–5.2)
Sodium: 139 mmol/L (ref 134–144)
Total Protein: 7.1 g/dL (ref 6.0–8.5)
eGFR: 123 mL/min/{1.73_m2} (ref >59)

## 2021-10-07 LAB — RPR, QUANT+TP ABS (REFLEX)
Rapid Plasma Reagin, Quant: 1:2 {titer} — ABNORMAL HIGH
T Pallidum Abs: REACTIVE — AB

## 2021-10-07 LAB — CYTOLOGY, (ORAL, ANAL, URETHRAL) ANCILLARY ONLY
Chlamydia: NEGATIVE
Chlamydia: NEGATIVE
Chlamydia: NEGATIVE
Comment: NEGATIVE
Comment: NEGATIVE
Comment: NORMAL
Comment: NEGATIVE
Comment: NEGATIVE
Comment: NEGATIVE
Comment: NEGATIVE
Comment: NORMAL
Comment: NORMAL
Neisseria Gonorrhea: NEGATIVE
Neisseria Gonorrhea: NEGATIVE
Neisseria Gonorrhea: NEGATIVE
Trichomonas: NEGATIVE
Trichomonas: NEGATIVE
Trichomonas: NEGATIVE

## 2021-10-07 LAB — HEPB+HEPC+HIV PANEL
HIV Screen 4th Generation wRfx: NONREACTIVE
Hep B C IgM: NEGATIVE
Hep B Core Total Ab: NEGATIVE
Hep B E Ab: NEGATIVE
Hep B E Ag: NEGATIVE
Hep B Surface Ab, Qual: REACTIVE
Hep C Virus Ab: <0.1 s/co ratio (ref 0.0–0.9)
Hepatitis B Surface Ag: NEGATIVE

## 2021-10-07 LAB — SYPHILIS: RPR W/REFLEX TO RPR TITER AND TREPONEMAL ANTIBODIES, TRADITIONAL SCREENING AND DIAGNOSIS ALGORITHM: RPR Ser Ql: REACTIVE — AB

## 2021-12-26 DIAGNOSIS — A549 Gonococcal infection, unspecified: Secondary | ICD-10-CM

## 2021-12-26 HISTORY — DX: Gonococcal infection, unspecified: A54.9

## 2022-01-05 ENCOUNTER — Encounter: Payer: Self-pay | Admitting: Family Medicine

## 2022-01-05 ENCOUNTER — Other Ambulatory Visit (HOSPITAL_COMMUNITY)
Admission: RE | Admit: 2022-01-05 | Discharge: 2022-01-05 | Disposition: A | Discharge status: Home / Self Care | Payer: Medicaid Other | Source: Ambulatory Visit | Attending: Family Medicine | Admitting: Family Medicine

## 2022-01-05 ENCOUNTER — Ambulatory Visit: Payer: Medicaid Other | Admitting: Family Medicine

## 2022-01-05 VITALS — BP 127/81 | HR 85 | Temp 98.2°F | Ht 70.0 in | Wt 287.0 lb

## 2022-01-05 DIAGNOSIS — Z8619 Personal history of other infectious and parasitic diseases: Secondary | ICD-10-CM | POA: Diagnosis not present

## 2022-01-05 DIAGNOSIS — B009 Herpesviral infection, unspecified: Secondary | ICD-10-CM

## 2022-01-05 DIAGNOSIS — N529 Male erectile dysfunction, unspecified: Secondary | ICD-10-CM

## 2022-01-05 DIAGNOSIS — Z79899 Other long term (current) drug therapy: Secondary | ICD-10-CM

## 2022-01-05 DIAGNOSIS — Z7252 High risk homosexual behavior: Secondary | ICD-10-CM | POA: Diagnosis not present

## 2022-01-05 MED ORDER — DESCOVY 200-25 MG PO TABS: 1.0000 | ORAL_TABLET | Freq: Every day | ORAL | 0 refills | Status: DC
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
## 2022-01-06 LAB — BASIC METABOLIC PANEL
BUN/Creatinine Ratio: 12 (ref 9–20)
BUN: 10 mg/dL (ref 6–20)
CO2: 22 mmol/L (ref 20–29)
Calcium: 9.7 mg/dL (ref 8.7–10.2)
Chloride: 105 mmol/L (ref 96–106)
Creatinine, Ser: 0.84 mg/dL (ref 0.76–1.27)
Glucose: 106 mg/dL — ABNORMAL HIGH (ref 70–99)
Potassium: 4 mmol/L (ref 3.5–5.2)
Sodium: 144 mmol/L (ref 134–144)
eGFR: 121 mL/min/{1.73_m2} (ref >59)

## 2022-01-06 LAB — HIV ANTIBODY (ROUTINE TESTING W REFLEX): HIV Screen 4th Generation wRfx: NONREACTIVE

## 2022-01-06 LAB — HSV(HERPES SIMPLEX VRS) I + II AB-IGG
HSV 1 Glycoprotein G Ab, IgG: 3.39 index — ABNORMAL HIGH (ref 0.00–0.90)
HSV 2 IgG, Type Spec: 2.54 index — ABNORMAL HIGH (ref 0.00–0.90)

## 2022-01-06 LAB — HSV-2 IGG SUPPLEMENTAL TEST: HSV-2 IgG Supplemental Test: POSITIVE — AB

## 2022-01-06 LAB — RPR: RPR Ser Ql: NONREACTIVE

## 2022-01-07 ENCOUNTER — Other Ambulatory Visit: Payer: Self-pay | Admitting: Family Medicine

## 2022-01-07 DIAGNOSIS — A549 Gonococcal infection, unspecified: Secondary | ICD-10-CM

## 2022-01-07 LAB — CYTOLOGY, (ORAL, ANAL, URETHRAL) ANCILLARY ONLY
Chlamydia: NEGATIVE
Chlamydia: NEGATIVE
Chlamydia: NEGATIVE
Comment: NEGATIVE
Comment: NEGATIVE
Comment: NEGATIVE
Comment: NEGATIVE
Comment: NEGATIVE
Comment: NEGATIVE
Comment: NORMAL
Comment: NORMAL
Comment: NORMAL
Neisseria Gonorrhea: NEGATIVE
Neisseria Gonorrhea: NEGATIVE
Neisseria Gonorrhea: POSITIVE — AB
Trichomonas: NEGATIVE
Trichomonas: NEGATIVE
Trichomonas: NEGATIVE

## 2022-01-07 MED ORDER — DOXYCYCLINE HYCLATE 100 MG PO TABS: 100.0000 mg | ORAL_TABLET | Freq: Two times a day (BID) | ORAL | 0 refills | Status: AC

## 2022-01-10 ENCOUNTER — Ambulatory Visit: Payer: Medicaid Other

## 2022-01-10 DIAGNOSIS — A549 Gonococcal infection, unspecified: Secondary | ICD-10-CM

## 2022-01-10 MED ORDER — CEFTRIAXONE SODIUM 1 G IJ SOLR
1.0000 g | Freq: Once | INTRAMUSCULAR | Status: AC
  Administered 2022-01-10: 1 g via INTRAMUSCULAR

## 2022-01-18 ENCOUNTER — Ambulatory Visit: Payer: Medicaid Other | Admitting: Nurse Practitioner

## 2022-01-24 ENCOUNTER — Other Ambulatory Visit: Payer: Self-pay | Admitting: Family Medicine

## 2022-01-24 DIAGNOSIS — B009 Herpesviral infection, unspecified: Secondary | ICD-10-CM

## 2022-01-26 ENCOUNTER — Other Ambulatory Visit: Payer: Self-pay | Admitting: *Deleted

## 2022-01-26 DIAGNOSIS — B009 Herpesviral infection, unspecified: Secondary | ICD-10-CM

## 2022-01-26 MED ORDER — VALACYCLOVIR HCL 1 G PO TABS: 1000.0000 mg | ORAL_TABLET | Freq: Every day | ORAL | 1 refills | Status: DC

## 2022-01-31 ENCOUNTER — Ambulatory Visit: Payer: Medicaid Other | Admitting: Urology

## 2022-02-08 DIAGNOSIS — J029 Acute pharyngitis, unspecified: Secondary | ICD-10-CM | POA: Diagnosis not present

## 2022-02-11 ENCOUNTER — Ambulatory Visit: Payer: Medicaid Other | Admitting: Nurse Practitioner

## 2022-02-28 ENCOUNTER — Ambulatory Visit: Payer: Medicaid Other | Admitting: Urology

## 2022-03-07 ENCOUNTER — Ambulatory Visit: Payer: Medicaid Other | Admitting: Nurse Practitioner

## 2022-03-07 ENCOUNTER — Encounter: Payer: Self-pay | Admitting: Nurse Practitioner

## 2022-03-07 ENCOUNTER — Other Ambulatory Visit (HOSPITAL_COMMUNITY)
Admission: RE | Admit: 2022-03-07 | Discharge: 2022-03-07 | Disposition: A | Discharge status: Home / Self Care | Payer: Medicaid Other | Source: Ambulatory Visit | Attending: Nurse Practitioner | Admitting: Nurse Practitioner

## 2022-03-07 VITALS — BP 135/95 | HR 75 | Temp 98.3°F | Resp 20 | Ht 70.0 in | Wt 281.0 lb

## 2022-03-07 DIAGNOSIS — Z8619 Personal history of other infectious and parasitic diseases: Secondary | ICD-10-CM

## 2022-03-07 NOTE — Progress Notes (Signed)
? ?  Subjective:  ? ? Patient ID: Gerald [name], male    DOB: November 06, 1991, 31 y.o.   MRN: [id number] ? ?Chief Complaint: Recheck for STD (Was treated fpor gonarrhea and needs to be rechecked) ? ? ?HPI ?Patient comes in today for recheck of oral gonorrhea. He was dx with gonorrhea on 01/07/22 and was treated with doxycycline. Patient has a very high risk sexual behavior and does not practice safe sex. He claims that he has not had sex since he was dx with gonorrhea.He is on descovey and takes it every [name].  ? ? ? ?Review of Systems  ?Constitutional:  Negative for diaphoresis.  ?Eyes:  Negative for pain.  ?Respiratory:  Negative for shortness of breath.   ?Cardiovascular:  Negative for chest pain, palpitations and leg swelling.  ?Gastrointestinal:  Negative for abdominal pain.  ?Endocrine: Negative for polydipsia.  ?Genitourinary:  Negative for dysuria, penile discharge, penile pain and penile swelling.  ?Skin:  Negative for rash.  ?Neurological:  Negative for dizziness, weakness and headaches.  ?Hematological:  Does not bruise/bleed easily.  ?All other systems reviewed and are negative. ? ?   ?Objective:  ? Physical Exam ?Vitals reviewed.  ?Constitutional:   ?   Appearance: Normal appearance. He is obese.  ?Cardiovascular:  ?   Rate and Rhythm: Normal rate and regular rhythm.  ?   Heart sounds: Normal heart sounds.  ?Pulmonary:  ?   Breath sounds: Normal breath sounds.  ?Skin: ?   General: Skin is warm.  ?Neurological:  ?   General: No focal deficit present.  ?   Mental Status: He is alert.  ? ? ?BP (!) 135/95   Pulse 75   Temp 98.3 ?F (36.8 ?C) (Temporal)   Resp 20   Ht 5\' 10"  (1.778 m)   Wt 281 lb (127.5 kg)   SpO2 100%   BMI 40.32 kg/m?  ? ? ? ?   ?Assessment & Plan:  ?Gerald [name] in today with chief complaint of Recheck for STD (Was treated fpor gonarrhea and needs to be rechecked) ? ? ?1. History of gonorrhea ?Safe sex encouraged ?- Cytology (oral, anal, urethral) ancillary only ? ? ? ?The above  assessment and management plan was discussed with the patient. The patient verbalized understanding of and has agreed to the management plan. Patient is aware to call the clinic if symptoms persist or worsen. Patient is aware when to return to the clinic for a follow-up visit. Patient educated on when it is appropriate to go to the emergency department.  ? ?Mary-Margaret Cassell Clement, FNP ? ? ? ?

## 2022-03-10 LAB — CYTOLOGY, (ORAL, ANAL, URETHRAL) ANCILLARY ONLY
Comment: NORMAL
Neisseria Gonorrhea: NEGATIVE

## 2022-04-05 ENCOUNTER — Ambulatory Visit: Payer: Medicaid Other | Admitting: Family Medicine

## 2022-04-15 ENCOUNTER — Ambulatory Visit: Payer: Medicaid Other | Admitting: Family Medicine

## 2022-04-15 ENCOUNTER — Other Ambulatory Visit (HOSPITAL_COMMUNITY)
Admission: RE | Admit: 2022-04-15 | Discharge: 2022-04-15 | Disposition: A | Discharge status: Home / Self Care | Payer: Medicaid Other | Source: Ambulatory Visit | Attending: Family Medicine | Admitting: Family Medicine

## 2022-04-15 ENCOUNTER — Encounter: Payer: Self-pay | Admitting: Family Medicine

## 2022-04-15 VITALS — BP 131/91 | HR 78 | Temp 98.5°F | Ht 70.0 in | Wt 278.0 lb

## 2022-04-15 DIAGNOSIS — I10 Essential (primary) hypertension: Secondary | ICD-10-CM | POA: Diagnosis not present

## 2022-04-15 DIAGNOSIS — Z7252 High risk homosexual behavior: Secondary | ICD-10-CM

## 2022-04-15 DIAGNOSIS — B009 Herpesviral infection, unspecified: Secondary | ICD-10-CM

## 2022-04-15 DIAGNOSIS — Z8619 Personal history of other infectious and parasitic diseases: Secondary | ICD-10-CM | POA: Insufficient documentation

## 2022-04-15 DIAGNOSIS — Z79899 Other long term (current) drug therapy: Secondary | ICD-10-CM | POA: Diagnosis not present

## 2022-04-15 LAB — BAYER DCA HB A1C WAIVED: HB A1C (BAYER DCA - WAIVED): 5.7 % — ABNORMAL HIGH (ref 4.8–5.6)

## 2022-04-15 NOTE — Progress Notes (Signed)
? ?Subjective: ?CC: Interval PrEP follow-up ?PCP: Janora Norlander, DO ?AGT:Gerald [name] is a 31 y.o. male presenting to clinic today for: ? ?1.  Prep/high risk sexual practices ?Patient is no longer seeing the gentleman that gave him gonorrhea.  He had a test of cure which was negative.  He has had 2 partners since our last checkup, 1 of which whom he engaged in unprotected intercourse.  He has had oral intercourse recently as well.  He denies any sore throat, fevers, penile or rectal discharge or bleeding.  No dysuria.  Compliant with medications and no missed doses. ? ?2.  Morbid obesity, hypertension ?Patient is compliant with his Norvasc.  He has started walking several miles per day and efforts to lose weight.  He is cutting back on sodas and increasing water intake.  He admits that he continues to eat fried foods however and is working on this. ? ? ?ROS: Per HPI ? ?No Known Allergies ?Past Medical History:  ?Diagnosis Date  ? Hypertension   ? Syphilis   ? treated at [facility]  ? ? ?Current Outpatient Medications:  ?  amLODipine (NORVASC) 2.5 MG tablet, Take 1 tablet (2.5 mg total) by mouth daily., Disp: 90 tablet, Rfl: 3 ?  emtricitabine-tenofovir AF (DESCOVY) 200-25 MG tablet, Take 1 tablet by mouth daily., Disp: 90 tablet, Rfl: 0 ?  valACYclovir (VALTREX) 1000 MG tablet, Take 1 tablet (1,000 mg total) by mouth daily., Disp: 90 tablet, Rfl: 1 ?Social History  ? ?Socioeconomic History  ? Marital status: Single  ?  Spouse name: Not on file  ? Number of children: Not on file  ? Years of education: Not on file  ? Highest education level: Not on file  ?Occupational History  ? Not on file  ?Tobacco Use  ? Smoking status: Every Day  ?  Packs/day: 0.50  ?  Types: Cigarettes  ? Smokeless tobacco: Current  ?Vaping Use  ? Vaping Use: Never used  ?Substance and Sexual Activity  ? Alcohol use: No  ? Drug use: No  ? Sexual activity: Yes  ?  Birth control/protection: Condom  ?  Comment: gay  ?Other Topics Concern  ? Not  on file  ?Social History Narrative  ? Not on file  ? ?Social Determinants of Health  ? ?Financial Resource Strain: Not on file  ?Food Insecurity: Not on file  ?Transportation Needs: Not on file  ?Physical Activity: Not on file  ?Stress: Not on file  ?Social Connections: Not on file  ?Intimate Partner Violence: Not on file  ? ?Family History  ?Problem Relation Age of Onset  ? Diabetes Mother   ? Hypertension Mother   ? Hypertension Paternal Grandmother   ? Diabetes Paternal Grandmother   ? ? ?Objective: ?Office vital signs reviewed. ?BP (!) 131/91   Pulse 78   Temp 98.5 ?F (36.9 ?C)   Ht 5' 10"  (1.778 m)   Wt 278 lb (126.1 kg)   SpO2 93%   BMI 39.89 kg/m?  ? ?Physical Examination:  ?General: Awake, alert, well-appearing, morbidly obese male, No acute distress ?HEENT: No lymphadenopathy.  Oropharynx without erythema exudates or lesions. ?Cardio: regular rate and rhythm  ?Pulm: normal work of breathing on room air ?GU: No penile discharge or lesions appreciated.  No rectal discharge, bleeding or lesions appreciated. ? ?Assessment/ Plan: ?31 y.o. male  ? ?On pre-exposure prophylaxis for HIV - Plan: Cytology (oral, anal, urethral) ancillary only, Cytology (oral, anal, urethral) ancillary only, Cytology (oral, anal, urethral) ancillary only,  CMP14+EGFR, HIV antibody (with reflex), HSV(herpes simplex vrs) 1+2 ab-IgG, RPR ? ?High risk homosexual behavior - Plan: Cytology (oral, anal, urethral) ancillary only, Cytology (oral, anal, urethral) ancillary only, Cytology (oral, anal, urethral) ancillary only, CMP14+EGFR, HIV antibody (with reflex), HSV(herpes simplex vrs) 1+2 ab-IgG, RPR ? ?History of chlamydia infection - Plan: Cytology (oral, anal, urethral) ancillary only, Cytology (oral, anal, urethral) ancillary only, Cytology (oral, anal, urethral) ancillary only, CMP14+EGFR, HIV antibody (with reflex), HSV(herpes simplex vrs) 1+2 ab-IgG, RPR ? ?History of gonorrhea - Plan: Cytology (oral, anal, urethral) ancillary  only, Cytology (oral, anal, urethral) ancillary only, Cytology (oral, anal, urethral) ancillary only, CMP14+EGFR, HIV antibody (with reflex), HSV(herpes simplex vrs) 1+2 ab-IgG, RPR ? ?History of syphilis - Plan: Cytology (oral, anal, urethral) ancillary only, Cytology (oral, anal, urethral) ancillary only, Cytology (oral, anal, urethral) ancillary only, CMP14+EGFR, HIV antibody (with reflex), HSV(herpes simplex vrs) 1+2 ab-IgG, RPR ? ?HSV-2 infection - Plan: Cytology (oral, anal, urethral) ancillary only, Cytology (oral, anal, urethral) ancillary only, Cytology (oral, anal, urethral) ancillary only, CMP14+EGFR, HIV antibody (with reflex), HSV(herpes simplex vrs) 1+2 ab-IgG, RPR ? ?Morbid obesity ([place]) - Plan: Bayer DCA Hb A1c Waived ? ?Essential hypertension ? ?STI testing ordered.  No missed doses of disc OV or Valtrex.  We discussed importance of compliance with these regimen so as to reduce risk.  Unfortunately continues to engage in high risk sexual practices. ? ?A1c given morbid obesity but he is in the action phase of lifestyle modification.  I have congratulated him on this.  Should his insurance start covering pharmacologic intervention I am glad to initiate. ? ?Blood pressure is not at goal with diastolics above 90.  He we will continue Norvasc for now and continue lifestyle modifications.  If persistently uncontrolled at next visit we will advance the Norvasc to 10 mg daily. ? ?Orders Placed This Encounter  ?Procedures  ? CMP14+EGFR  ? HIV antibody (with reflex)  ? HSV(herpes simplex vrs) 1+2 ab-IgG  ? RPR  ? Bayer DCA Hb A1c Waived  ? ?No orders of the defined types were placed in this encounter. ? ? ? ?Janora Norlander, DO ?[place] ?[phone] ? ?

## 2022-04-18 ENCOUNTER — Telehealth: Payer: Self-pay | Admitting: Family Medicine

## 2022-04-18 LAB — CMP14+EGFR
ALT: 32 IU/L (ref 0–44)
AST: 23 IU/L (ref 0–40)
Albumin/Globulin Ratio: 1.7 (ref 1.2–2.2)
Albumin: 4.6 g/dL (ref 4.1–5.2)
Alkaline Phosphatase: 89 IU/L (ref 44–121)
BUN/Creatinine Ratio: 14 (ref 9–20)
BUN: 12 mg/dL (ref 6–20)
Bilirubin Total: 0.3 mg/dL (ref 0.0–1.2)
CO2: 21 mmol/L (ref 20–29)
Calcium: 9.6 mg/dL (ref 8.7–10.2)
Chloride: 102 mmol/L (ref 96–106)
Creatinine, Ser: 0.88 mg/dL (ref 0.76–1.27)
Globulin, Total: 2.7 g/dL (ref 1.5–4.5)
Glucose: 84 mg/dL (ref 70–99)
Potassium: 3.9 mmol/L (ref 3.5–5.2)
Sodium: 141 mmol/L (ref 134–144)
Total Protein: 7.3 g/dL (ref 6.0–8.5)
eGFR: 119 mL/min/{1.73_m2} (ref >59)

## 2022-04-18 LAB — HSV-2 IGG SUPPLEMENTAL TEST

## 2022-04-18 LAB — HIV ANTIBODY (ROUTINE TESTING W REFLEX): HIV Screen 4th Generation wRfx: NONREACTIVE

## 2022-04-18 LAB — RPR, QUANT+TP ABS (REFLEX)
Rapid Plasma Reagin, Quant: 1:1 {titer} — ABNORMAL HIGH
T Pallidum Abs: REACTIVE — AB

## 2022-04-18 LAB — HSV(HERPES SIMPLEX VRS) I + II AB-IGG
HSV 1 Glycoprotein G Ab, IgG: 3.82 index — ABNORMAL HIGH (ref 0.00–0.90)
HSV 2 IgG, Type Spec: 2.55 index — ABNORMAL HIGH (ref 0.00–0.90)

## 2022-04-18 LAB — RPR: RPR Ser Ql: REACTIVE — AB

## 2022-04-18 NOTE — Telephone Encounter (Signed)
I have no idea what this means. We ordered the same thing we always do.  Can they clarify?? ?

## 2022-04-18 NOTE — Telephone Encounter (Signed)
Lab needed to know what to test for, let them no trich ghonorrhea,  ?

## 2022-04-18 NOTE — Telephone Encounter (Signed)
[facility] called stating that they received request for pt to have Acillary Swab and sees specimen description but says its not an actual order. Needs to be an actual order. ?

## 2022-04-19 LAB — CYTOLOGY, (ORAL, ANAL, URETHRAL) ANCILLARY ONLY
Chlamydia: NEGATIVE
Comment: NEGATIVE
Comment: NEGATIVE
Comment: NORMAL
Neisseria Gonorrhea: NEGATIVE
Trichomonas: NEGATIVE

## 2022-04-20 LAB — CYTOLOGY, (ORAL, ANAL, URETHRAL) ANCILLARY ONLY
Chlamydia: NEGATIVE
Chlamydia: NEGATIVE
Comment: NEGATIVE
Comment: NEGATIVE
Comment: NEGATIVE
Comment: NEGATIVE
Comment: NORMAL
Comment: NORMAL
Neisseria Gonorrhea: NEGATIVE
Neisseria Gonorrhea: NEGATIVE
Trichomonas: NEGATIVE
Trichomonas: NEGATIVE

## 2022-04-27 ENCOUNTER — Other Ambulatory Visit: Payer: Self-pay | Admitting: Family Medicine

## 2022-04-27 DIAGNOSIS — Z79899 Other long term (current) drug therapy: Secondary | ICD-10-CM

## 2022-04-27 DIAGNOSIS — Z7252 High risk homosexual behavior: Secondary | ICD-10-CM

## 2022-04-29 DIAGNOSIS — G4489 Other headache syndrome: Secondary | ICD-10-CM | POA: Diagnosis not present

## 2022-04-29 DIAGNOSIS — Z041 Encounter for examination and observation following transport accident: Secondary | ICD-10-CM | POA: Diagnosis not present

## 2022-04-29 DIAGNOSIS — I10 Essential (primary) hypertension: Secondary | ICD-10-CM | POA: Diagnosis not present

## 2022-04-29 DIAGNOSIS — F17210 Nicotine dependence, cigarettes, uncomplicated: Secondary | ICD-10-CM | POA: Diagnosis not present

## 2022-04-29 DIAGNOSIS — S7011XA Contusion of right thigh, initial encounter: Secondary | ICD-10-CM | POA: Diagnosis not present

## 2022-05-02 ENCOUNTER — Other Ambulatory Visit: Payer: Self-pay

## 2022-05-02 ENCOUNTER — Encounter (HOSPITAL_COMMUNITY): Payer: Self-pay | Admitting: Emergency Medicine

## 2022-05-02 ENCOUNTER — Emergency Department (HOSPITAL_COMMUNITY): Payer: Medicaid Other

## 2022-05-02 ENCOUNTER — Emergency Department (HOSPITAL_COMMUNITY)
Admission: EM | Admit: 2022-05-02 | Discharge: 2022-05-02 | Disposition: A | Discharge status: Home / Self Care | Payer: Medicaid Other | Attending: Student | Admitting: Student

## 2022-05-02 DIAGNOSIS — S0990XA Unspecified injury of head, initial encounter: Secondary | ICD-10-CM | POA: Diagnosis not present

## 2022-05-02 DIAGNOSIS — R519 Headache, unspecified: Secondary | ICD-10-CM | POA: Diagnosis not present

## 2022-05-02 DIAGNOSIS — Y92410 Unspecified street and highway as the place of occurrence of the external cause: Secondary | ICD-10-CM | POA: Diagnosis not present

## 2022-05-02 DIAGNOSIS — S199XXA Unspecified injury of neck, initial encounter: Secondary | ICD-10-CM | POA: Diagnosis not present

## 2022-05-02 DIAGNOSIS — M542 Cervicalgia: Secondary | ICD-10-CM | POA: Diagnosis not present

## 2022-05-02 MED ORDER — KETOROLAC TROMETHAMINE 30 MG/ML IJ SOLN
30.0000 mg | Freq: Once | INTRAMUSCULAR | Status: AC
  Administered 2022-05-02: 30 mg via INTRAMUSCULAR
  Filled 2022-05-02: qty 1
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
## 2022-05-02 NOTE — Discharge Instructions (Signed)
Take Tylenol Motrin for headache, the CT scan of your head and neck were reassuring and does not show any break or bleed.  Follow-up with the PCP if your symptoms persist ?

## 2022-05-03 ENCOUNTER — Telehealth: Payer: Self-pay

## 2022-05-03 NOTE — Telephone Encounter (Signed)
Transition Care Management Unsuccessful Follow-up Telephone Call ? ?Date of discharge and from where:  5/8/20023-[facility]  ? ?Attempts:  1st Attempt ? ?Reason for unsuccessful TCM follow-up call:  Left voice message ? ?  ?

## 2022-05-04 NOTE — Telephone Encounter (Signed)
Transition Care Management Follow-up Telephone Call ?Date of discharge and from where: 05/02/2022 from [facility] ?How have you been since you were released from the hospital? Patient stated that he is feeling better. Patient did not have any questions or concerns at this time.  ?Any questions or concerns? No ? ?Items Reviewed: ?Did the pt receive and understand the discharge instructions provided? Yes  ?Medications obtained and verified? Yes  ?Other? No  ?Any new allergies since your discharge? No  ?Dietary orders reviewed? No ?Do you have support at home? Yes  ? ?Functional Questionnaire: (I = Independent and D = Dependent) ?ADLs: I ? ?Bathing/Dressing- I ? ?Meal Prep- I ? ?Eating- I ? ?Maintaining continence- I ? ?Transferring/Ambulation- I ? ?Managing Meds- I ? ? ?Follow up appointments reviewed: ? ?PCP Hospital f/u appt confirmed? Yes  Scheduled to see Delynn Flavin, DO on 05/19/2022 @ 2:00pm. ?Specialist Hospital f/u appt confirmed? No   ?Are transportation arrangements needed? No  ?If their condition worsens, is the pt aware to call PCP or go to the Emergency Dept.? Yes ?Was the patient provided with contact information for the PCP's office or ED? Yes ?Was to pt encouraged to call back with questions or concerns? Yes ? ?

## 2022-05-09 DIAGNOSIS — G44319 Acute post-traumatic headache, not intractable: Secondary | ICD-10-CM | POA: Diagnosis not present

## 2022-05-09 DIAGNOSIS — M25512 Pain in left shoulder: Secondary | ICD-10-CM | POA: Diagnosis not present

## 2022-05-19 ENCOUNTER — Encounter: Payer: Self-pay | Admitting: Family Medicine

## 2022-05-19 ENCOUNTER — Ambulatory Visit: Payer: Medicaid Other | Admitting: Family Medicine

## 2022-05-19 VITALS — BP 128/91 | HR 78 | Temp 97.8°F | Ht 70.0 in | Wt 274.8 lb

## 2022-05-19 DIAGNOSIS — M542 Cervicalgia: Secondary | ICD-10-CM | POA: Diagnosis not present

## 2022-05-19 DIAGNOSIS — G479 Sleep disorder, unspecified: Secondary | ICD-10-CM

## 2022-05-19 DIAGNOSIS — F515 Nightmare disorder: Secondary | ICD-10-CM | POA: Diagnosis not present

## 2022-05-19 DIAGNOSIS — F4321 Adjustment disorder with depressed mood: Secondary | ICD-10-CM

## 2022-05-19 MED ORDER — PRAZOSIN HCL 1 MG PO CAPS: 1.0000 mg | ORAL_CAPSULE | Freq: Every day | ORAL | 3 refills | Status: DC

## 2022-05-19 NOTE — Patient Instructions (Signed)
Keep July appointment. I want to recheck your sleep/ nightmares during that visit Prazosin added for sleep
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
## 2022-07-13 DIAGNOSIS — Z7251 High risk heterosexual behavior: Secondary | ICD-10-CM | POA: Diagnosis not present

## 2022-07-15 ENCOUNTER — Ambulatory Visit: Payer: Medicaid Other | Admitting: Family Medicine

## 2022-07-22 ENCOUNTER — Other Ambulatory Visit (HOSPITAL_COMMUNITY)
Admission: RE | Admit: 2022-07-22 | Discharge: 2022-07-22 | Disposition: A | Discharge status: Home / Self Care | Payer: Medicaid Other | Source: Ambulatory Visit | Attending: Family Medicine | Admitting: Family Medicine

## 2022-07-22 ENCOUNTER — Ambulatory Visit: Payer: Medicaid Other | Admitting: Family Medicine

## 2022-07-22 ENCOUNTER — Other Ambulatory Visit: Payer: Self-pay | Admitting: Family Medicine

## 2022-07-22 ENCOUNTER — Encounter: Payer: Self-pay | Admitting: Family Medicine

## 2022-07-22 VITALS — BP 143/97 | HR 85 | Temp 97.5°F | Ht 70.0 in | Wt 279.8 lb

## 2022-07-22 DIAGNOSIS — F4321 Adjustment disorder with depressed mood: Secondary | ICD-10-CM | POA: Diagnosis not present

## 2022-07-22 DIAGNOSIS — Z8619 Personal history of other infectious and parasitic diseases: Secondary | ICD-10-CM

## 2022-07-22 DIAGNOSIS — Z79899 Other long term (current) drug therapy: Secondary | ICD-10-CM

## 2022-07-22 DIAGNOSIS — B009 Herpesviral infection, unspecified: Secondary | ICD-10-CM | POA: Insufficient documentation

## 2022-07-22 DIAGNOSIS — Z7252 High risk homosexual behavior: Secondary | ICD-10-CM | POA: Diagnosis not present

## 2022-07-22 DIAGNOSIS — I10 Essential (primary) hypertension: Secondary | ICD-10-CM | POA: Diagnosis not present
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
## 2022-07-25 ENCOUNTER — Other Ambulatory Visit: Payer: Self-pay | Admitting: Family Medicine

## 2022-07-25 DIAGNOSIS — B009 Herpesviral infection, unspecified: Secondary | ICD-10-CM

## 2022-07-25 LAB — BASIC METABOLIC PANEL
BUN/Creatinine Ratio: 9 (ref 9–20)
BUN: 8 mg/dL (ref 6–20)
CO2: 20 mmol/L (ref 20–29)
Calcium: 9.7 mg/dL (ref 8.7–10.2)
Chloride: 102 mmol/L (ref 96–106)
Creatinine, Ser: 0.86 mg/dL (ref 0.76–1.27)
Glucose: 81 mg/dL (ref 70–99)
Potassium: 4.2 mmol/L (ref 3.5–5.2)
Sodium: 141 mmol/L (ref 134–144)
eGFR: 120 mL/min/{1.73_m2} (ref >59)

## 2022-07-25 LAB — HSV(HERPES SIMPLEX VRS) I + II AB-IGG
HSV 1 Glycoprotein G Ab, IgG: 4.61 index — ABNORMAL HIGH (ref 0.00–0.90)
HSV 2 IgG, Type Spec: 2.73 index — ABNORMAL HIGH (ref 0.00–0.90)

## 2022-07-25 LAB — HSV-2 IGG SUPPLEMENTAL TEST

## 2022-07-25 LAB — RPR: RPR Ser Ql: REACTIVE — AB

## 2022-07-25 LAB — RPR, QUANT+TP ABS (REFLEX)
Rapid Plasma Reagin, Quant: 1:2 {titer} — ABNORMAL HIGH
T Pallidum Abs: REACTIVE — AB

## 2022-07-25 LAB — HIV ANTIBODY (ROUTINE TESTING W REFLEX): HIV Screen 4th Generation wRfx: NONREACTIVE

## 2022-07-26 LAB — CYTOLOGY, (ORAL, ANAL, URETHRAL) ANCILLARY ONLY
Chlamydia: NEGATIVE
Chlamydia: NEGATIVE
Chlamydia: NEGATIVE
Comment: NEGATIVE
Comment: NEGATIVE
Comment: NORMAL
Comment: NEGATIVE
Comment: NEGATIVE
Comment: NEGATIVE
Comment: NEGATIVE
Comment: NORMAL
Comment: NORMAL
Neisseria Gonorrhea: NEGATIVE
Neisseria Gonorrhea: NEGATIVE
Neisseria Gonorrhea: NEGATIVE
Trichomonas: NEGATIVE
Trichomonas: NEGATIVE
Trichomonas: NEGATIVE

## 2022-09-19 ENCOUNTER — Other Ambulatory Visit: Payer: Self-pay | Admitting: Family Medicine

## 2022-09-19 DIAGNOSIS — I10 Essential (primary) hypertension: Secondary | ICD-10-CM

## 2022-10-14 ENCOUNTER — Other Ambulatory Visit: Payer: Self-pay | Admitting: Family Medicine

## 2022-10-14 DIAGNOSIS — Z79899 Other long term (current) drug therapy: Secondary | ICD-10-CM

## 2022-10-14 DIAGNOSIS — Z7252 High risk homosexual behavior: Secondary | ICD-10-CM

## 2022-10-14 NOTE — Telephone Encounter (Signed)
Will patient run out before our next visit?  Technically has to have HIV testing with each refill (q35m)

## 2022-10-17 NOTE — Telephone Encounter (Signed)
TC to pt today, he has 14 pills left, his appt is next Tues 10/25/22 will deny refill.

## 2022-10-21 ENCOUNTER — Ambulatory Visit: Payer: Medicaid Other | Admitting: Family Medicine

## 2022-10-24 ENCOUNTER — Other Ambulatory Visit: Payer: Self-pay | Admitting: Family Medicine

## 2022-10-24 DIAGNOSIS — B009 Herpesviral infection, unspecified: Secondary | ICD-10-CM

## 2022-10-25 ENCOUNTER — Other Ambulatory Visit (HOSPITAL_COMMUNITY)
Admission: RE | Admit: 2022-10-25 | Discharge: 2022-10-25 | Disposition: A | Discharge status: Home / Self Care | Payer: Medicaid Other | Source: Ambulatory Visit | Attending: Family Medicine | Admitting: Family Medicine

## 2022-10-25 ENCOUNTER — Encounter: Payer: Self-pay | Admitting: Family Medicine

## 2022-10-25 ENCOUNTER — Ambulatory Visit: Payer: Medicaid Other | Admitting: Family Medicine

## 2022-10-25 VITALS — BP 123/84 | HR 66 | Temp 98.3°F | Ht 70.0 in | Wt 272.8 lb

## 2022-10-25 DIAGNOSIS — B009 Herpesviral infection, unspecified: Secondary | ICD-10-CM | POA: Diagnosis not present

## 2022-10-25 DIAGNOSIS — Z79899 Other long term (current) drug therapy: Secondary | ICD-10-CM

## 2022-10-25 DIAGNOSIS — I10 Essential (primary) hypertension: Secondary | ICD-10-CM

## 2022-10-25 DIAGNOSIS — Z8619 Personal history of other infectious and parasitic diseases: Secondary | ICD-10-CM | POA: Diagnosis not present

## 2022-10-25 DIAGNOSIS — Z7252 High risk homosexual behavior: Secondary | ICD-10-CM | POA: Diagnosis not present

## 2022-10-25 MED ORDER — VALACYCLOVIR HCL 1 G PO TABS: 1000.0000 mg | ORAL_TABLET | Freq: Every day | ORAL | 2 refills | Status: DC

## 2022-10-25 MED ORDER — DESCOVY 200-25 MG PO TABS: 1.0000 | ORAL_TABLET | Freq: Every day | ORAL | 0 refills | Status: DC

## 2022-10-25 MED ORDER — AMLODIPINE BESYLATE 2.5 MG PO TABS: 2.5000 mg | ORAL_TABLET | Freq: Every day | ORAL | 3 refills | Status: DC

## 2022-10-25 NOTE — Progress Notes (Signed)
Subjective: CC: Follow-up for PrEP PCP: Janora Norlander, DO IOE:[id number] Gerald [name] is a 31 y.o. male presenting to clinic today for:  1.  PrEP/ HSV2 Patient reports compliance with his Descovy.  No missed doses.  He has been sexually active with only 1 partner since our last visit 3 months ago.  This was oral sex.  No rectal.  He does admit that this was unprotected.  Denies any penile discharge, scrotal abnormalities including genital lesions on the penis.  He denies any pain with urination or hematuria.  No sore throat.  No fevers or rashes.  Compliant with Valtrex for HSV.  2.  Hypertension Patient plan with Norvasc 2.5 mg daily.  No dizziness, chest pain or shortness of breath reported    ROS: Per HPI  No Known Allergies Past Medical History:  Diagnosis Date   Hypertension    Syphilis    treated at HD    Current Outpatient Medications:    naproxen (NAPROSYN) 375 MG tablet, Take 375 mg by mouth 2 (two) times daily., Disp: , Rfl:    prazosin (MINIPRESS) 1 MG capsule, Take 1 capsule (1 mg total) by mouth at bedtime. For sleep/ nightmares, Disp: 90 capsule, Rfl: 3   amLODipine (NORVASC) 2.5 MG tablet, Take 1 tablet (2.5 mg total) by mouth daily., Disp: 90 tablet, Rfl: 3   emtricitabine-tenofovir AF (DESCOVY) 200-25 MG tablet, Take 1 tablet by mouth daily., Disp: 90 tablet, Rfl: 0   valACYclovir (VALTREX) 1000 MG tablet, Take 1 tablet (1,000 mg total) by mouth daily., Disp: 90 tablet, Rfl: 2 Social History   Socioeconomic History   Marital status: Single    Spouse name: Not on file   Number of children: Not on file   Years of education: Not on file   Highest education level: Not on file  Occupational History   Not on file  Tobacco Use   Smoking status: Every Day    Packs/day: 0.50    Types: Cigarettes   Smokeless tobacco: Current  Vaping Use   Vaping Use: Never used  Substance and Sexual Activity   Alcohol use: No   Drug use: No   Sexual activity: Yes    Birth  control/protection: Condom    Comment: gay  Other Topics Concern   Not on file  Social History Narrative   Not on file   Social Determinants of Health   Financial Resource Strain: Not on file  Food Insecurity: Not on file  Transportation Needs: Not on file  Physical Activity: Not on file  Stress: Not on file  Social Connections: Not on file  Intimate Partner Violence: Not on file   Family History  Problem Relation Age of Onset   Diabetes Mother    Hypertension Mother    Hypertension Paternal Grandmother    Diabetes Paternal Grandmother     Objective: Office vital signs reviewed. BP 123/84   Pulse 66   Temp 98.3 F (36.8 C)   Ht _0  (1.778 m)   Wt 272 lb 12.8 oz (123.7 kg)   SpO2 97%   BMI 39.14 kg/m   Physical Examination:  General: Awake, alert, well nourished, obese. No acute distress HEENT: Oropharynx without erythema, lesions or exudates Cardio: regular rate and rhythm  Pulm: Normal work of breathing on room air GI: soft, non-tender, non-distended, bowel sounds present x4, no hepatomegaly, no splenomegaly, no masses GU: No scrotal swelling.  No penile lesions.  No discharge appreciated.  No erythema or pain to  touch. Rectal: No appreciable fissures or discharge from the rectum.   Assessment/ Plan: 31 y.o. male   On pre-exposure prophylaxis for HIV - Plan: Cytology (oral, anal, urethral) ancillary only, Cytology (oral, anal, urethral) ancillary only, Cytology (oral, anal, urethral) ancillary only, HIV antibody (with reflex), HSV(herpes simplex vrs) 1+2 ab-IgG, RPR, Hepatitis C antibody, CMP14+EGFR, emtricitabine-tenofovir AF (DESCOVY) 200-25 MG tablet  High risk homosexual behavior - Plan: Cytology (oral, anal, urethral) ancillary only, Cytology (oral, anal, urethral) ancillary only, Cytology (oral, anal, urethral) ancillary only, HIV antibody (with reflex), HSV(herpes simplex vrs) 1+2 ab-IgG, RPR, Hepatitis C antibody, CMP14+EGFR, emtricitabine-tenofovir AF  (DESCOVY) 200-25 MG tablet  History of gonorrhea - Plan: Cytology (oral, anal, urethral) ancillary only, Cytology (oral, anal, urethral) ancillary only, Cytology (oral, anal, urethral) ancillary only, HIV antibody (with reflex), HSV(herpes simplex vrs) 1+2 ab-IgG, RPR, Hepatitis C antibody, CMP14+EGFR  History of syphilis - Plan: Cytology (oral, anal, urethral) ancillary only, Cytology (oral, anal, urethral) ancillary only, Cytology (oral, anal, urethral) ancillary only, HIV antibody (with reflex), HSV(herpes simplex vrs) 1+2 ab-IgG, RPR, Hepatitis C antibody, CMP14+EGFR  HSV-2 infection - Plan: Cytology (oral, anal, urethral) ancillary only, Cytology (oral, anal, urethral) ancillary only, Cytology (oral, anal, urethral) ancillary only, HIV antibody (with reflex), HSV(herpes simplex vrs) 1+2 ab-IgG, RPR, Hepatitis C antibody, CMP14+EGFR, valACYclovir (VALTREX) 1000 MG tablet  Essential hypertension - Plan: amLODipine (NORVASC) 2.5 MG tablet  Oral, anal and urethral cytology obtained today.  Check RPR, hepatitis C, CMP and HIV.  Descovy has been renewed x3 months.  Plan for follow-up in 3 months and we will do annual physical exam along with other normal testing  Blood pressure well controlled.  Discussed need for ongoing use of amlodipine.  Renewal sent  Orders Placed This Encounter  Procedures   HIV antibody (with reflex)   HSV(herpes simplex vrs) 1+2 ab-IgG   RPR   Hepatitis C antibody   CMP14+EGFR   Meds ordered this encounter  Medications   amLODipine (NORVASC) 2.5 MG tablet    Sig: Take 1 tablet (2.5 mg total) by mouth daily.    Dispense:  90 tablet    Refill:  3   emtricitabine-tenofovir AF (DESCOVY) 200-25 MG tablet    Sig: Take 1 tablet by mouth daily.    Dispense:  90 tablet    Refill:  0   valACYclovir (VALTREX) 1000 MG tablet    Sig: Take 1 tablet (1,000 mg total) by mouth daily.    Dispense:  90 tablet    Refill:  [place], [place] [phone]

## 2022-10-26 LAB — CMP14+EGFR
ALT: 43 IU/L (ref 0–44)
AST: 25 IU/L (ref 0–40)
Albumin/Globulin Ratio: 1.9 (ref 1.2–2.2)
Albumin: 4.7 g/dL (ref 4.3–5.2)
Alkaline Phosphatase: 89 IU/L (ref 44–121)
BUN/Creatinine Ratio: 10 (ref 9–20)
BUN: 8 mg/dL (ref 6–20)
Bilirubin Total: 0.2 mg/dL (ref 0.0–1.2)
CO2: 23 mmol/L (ref 20–29)
Calcium: 9.1 mg/dL (ref 8.7–10.2)
Chloride: 99 mmol/L (ref 96–106)
Creatinine, Ser: 0.8 mg/dL (ref 0.76–1.27)
Globulin, Total: 2.5 g/dL (ref 1.5–4.5)
Glucose: 86 mg/dL (ref 70–99)
Potassium: 3.7 mmol/L (ref 3.5–5.2)
Sodium: 137 mmol/L (ref 134–144)
Total Protein: 7.2 g/dL (ref 6.0–8.5)
eGFR: 123 mL/min/{1.73_m2} (ref >59)

## 2022-10-26 LAB — HIV ANTIBODY (ROUTINE TESTING W REFLEX): HIV Screen 4th Generation wRfx: NONREACTIVE

## 2022-10-26 LAB — HSV 1 AND 2 AB, IGG
HSV 1 Glycoprotein G Ab, IgG: 4.73 index — ABNORMAL HIGH (ref 0.00–0.90)
HSV 2 IgG, Type Spec: 2.09 index — ABNORMAL HIGH (ref 0.00–0.90)

## 2022-10-26 LAB — HEPATITIS C ANTIBODY: Hep C Virus Ab: NONREACTIVE

## 2022-10-26 LAB — RPR, QUANT+TP ABS (REFLEX)
Rapid Plasma Reagin, Quant: 1:2 {titer} — ABNORMAL HIGH
T Pallidum Abs: REACTIVE — AB

## 2022-10-26 LAB — RPR: RPR Ser Ql: REACTIVE — AB

## 2022-10-27 LAB — CYTOLOGY, (ORAL, ANAL, URETHRAL) ANCILLARY ONLY
Chlamydia: NEGATIVE
Chlamydia: NEGATIVE
Chlamydia: NEGATIVE
Comment: NEGATIVE
Comment: NEGATIVE
Comment: NEGATIVE
Comment: NEGATIVE
Comment: NEGATIVE
Comment: NEGATIVE
Comment: NORMAL
Comment: NORMAL
Comment: NORMAL
Neisseria Gonorrhea: NEGATIVE
Neisseria Gonorrhea: NEGATIVE
Neisseria Gonorrhea: NEGATIVE
Trichomonas: NEGATIVE
Trichomonas: NEGATIVE
Trichomonas: NEGATIVE

## 2023-01-18 ENCOUNTER — Other Ambulatory Visit: Payer: Self-pay | Admitting: Family Medicine

## 2023-01-18 DIAGNOSIS — Z79899 Other long term (current) drug therapy: Secondary | ICD-10-CM

## 2023-01-18 DIAGNOSIS — Z7252 High risk homosexual behavior: Secondary | ICD-10-CM

## 2023-01-23 ENCOUNTER — Other Ambulatory Visit: Payer: Self-pay | Admitting: Family Medicine

## 2023-01-23 DIAGNOSIS — Z7252 High risk homosexual behavior: Secondary | ICD-10-CM

## 2023-01-23 DIAGNOSIS — Z79899 Other long term (current) drug therapy: Secondary | ICD-10-CM

## 2023-01-24 ENCOUNTER — Other Ambulatory Visit: Payer: Self-pay | Admitting: Family Medicine

## 2023-01-24 DIAGNOSIS — Z7252 High risk homosexual behavior: Secondary | ICD-10-CM

## 2023-01-24 DIAGNOSIS — Z79899 Other long term (current) drug therapy: Secondary | ICD-10-CM

## 2023-01-25 ENCOUNTER — Other Ambulatory Visit: Payer: Self-pay | Admitting: Family Medicine

## 2023-01-25 DIAGNOSIS — Z7252 High risk homosexual behavior: Secondary | ICD-10-CM

## 2023-01-25 DIAGNOSIS — Z79899 Other long term (current) drug therapy: Secondary | ICD-10-CM

## 2023-01-25 NOTE — Telephone Encounter (Signed)
I sent #90 last visit, which would have gotten him to today's date.  His AVS notes: "Return in about 3 months (around 01/16/2023) for Annual physical, PreP.". not sure why he did not schedule but he HAS to have HIV labs and GC/CT EVERY 3 months to stay on HIV prevention.  This is not new to him as he has been on this med >1 year.  I can see him at 1:15 if he can come in today.  I can get Karl Luke to double book me.

## 2023-01-30 ENCOUNTER — Encounter: Payer: Self-pay | Admitting: Family

## 2023-01-30 ENCOUNTER — Ambulatory Visit (INDEPENDENT_AMBULATORY_CARE_PROVIDER_SITE_OTHER): Payer: Medicaid Other | Admitting: Family

## 2023-01-30 DIAGNOSIS — Z79899 Other long term (current) drug therapy: Secondary | ICD-10-CM | POA: Diagnosis not present

## 2023-01-30 DIAGNOSIS — Z7252 High risk homosexual behavior: Secondary | ICD-10-CM

## 2023-01-30 DIAGNOSIS — S79921A Unspecified injury of right thigh, initial encounter: Secondary | ICD-10-CM

## 2023-01-30 MED ORDER — DESCOVY 200-25 MG PO TABS: 1.0000 | ORAL_TABLET | Freq: Every day | ORAL | 0 refills | Status: DC
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
## 2023-01-30 NOTE — Patient Instructions (Signed)
Emtricitabine; Tenofovir Alafenamide Tablets What is this medication? EMTRICITABINE; TENOFOVIR ALAFENAMIDE (em trye SYE ta been; ten OF oh vir AL a FEN a mide) helps manage the symptoms of HIV infection. It may also be used for PrEP (pre-exposure prophylaxis), which lowers the risk of getting HIV through sex. It works by limiting the spread of HIV in the body. It is a combination of two antiretroviral medications. This medication is not a cure for HIV or AIDS and it may still be possible to spread HIV to others while taking it. It does not prevent other sexually transmitted infections (STIs). This medicine may be used for other purposes; ask your health care provider or pharmacist if you have questions. COMMON BRAND NAME(S): Descovy What should I tell my care team before I take this medication? They need to know if you have any of these conditions: Kidney disease Liver disease An unusual or allergic reaction to emtricitabine, tenofovir, other medications, foods, dyes, or preservatives Pregnant or trying to get pregnant Breast-feeding How should I use this medication? Take this medication by mouth. You can take it with or without food. If it upsets your stomach, take it with food. For your therapy to work as well as possible, take each dose exactly as prescribed on the prescription label. Do not skip doses. Skipping doses can make the HIV virus resistant to this and other medications. Keep taking this therapy unless your care team tells you to stop. A special MedGuide will be given to you by the pharmacist with each prescription and refill. Be sure to read this information carefully each time. Talk to your care team about the use of this medication in children. While it may be given to children for selected conditions, precautions do apply. Overdosage: If you think you have taken too much of this medicine contact a poison control center or emergency room at once. NOTE: This medicine is only for you.  Do not share this medicine with others. What if I miss a dose? If you miss a dose, take it as soon as you can. If it is almost time for your next dose, take only that dose. Do not take double or extra doses. What may interact with this medication? Do not take this medication with any of the following: Adefovir Any medication that contains emtricitabine or tenofovir Any medication that contains lamivudine This medication may also interact with the following: Certain antibiotics, such as rifabutin, rifampin, rifapentine, or aminoglycosides Certain medications for seizures, such as carbamazepine, oxcarbazepine, phenobarbital, or phenytoin Medications for viral infections, such as cidofovir, acyclovir, valacyclovir, ganciclovir, or valganciclovir NSAIDs, medications for pain and inflammation, such as ibuprofen or naproxen St. John's wort Tipranavir with ritonavir This list may not describe all possible interactions. Give your health care provider a list of all the medicines, herbs, non-prescription drugs, or dietary supplements you use. Also tell them if you smoke, drink alcohol, or use illegal drugs. Some items may interact with your medicine. What should I watch for while using this medication? Visit your care team for regular check-ups. Discuss any new symptoms with your care team. You will need to have important blood work done while on this medication. HIV is spread to others through sexual or blood contact. Talk to your care team about how to stop the spread of HIV. If you have hepatitis B, talk to your care team if you plan to stop this medication. The symptoms of hepatitis B may get worse if you stop this medication. What side effects  may I notice from receiving this medication? Side effects that you should report to your care team as soon as possible: Allergic reactions--skin rash, itching, hives, swelling of the face, lips, tongue, or throat High lactic acid level--muscle pain or cramps,  stomach pain, trouble breathing, general discomfort and fatigue Infection--fever, chills, cough, or sore throat Kidney injury--decrease in the amount of urine, swelling of the ankles, hands, or feet Liver injury--right upper belly pain, loss of appetite, nausea, light-colored stool, dark yellow or brown urine, yellowing skin or eyes, unusual weakness or fatigue Side effects that usually do not require medical attention (report to your care team if they continue or are bothersome): Diarrhea Fatigue Headache Nausea This list may not describe all possible side effects. Call your doctor for medical advice about side effects. You may report side effects to FDA at 1-800-FDA-1088. Where should I keep my medication? Keep out of the reach of children. Store at room temperature between 20 and 25 degrees C (68 and 77 degrees F). Throw away any unused medication after the expiration date. NOTE: This sheet is a summary. It may not cover all possible information. If you have questions about this medicine, talk to your doctor, pharmacist, or health care provider.  2023 Elsevier/Gold Standard (2015-04-28 00:00:00)

## 2023-01-31 LAB — HIV ANTIBODY (ROUTINE TESTING W REFLEX): HIV Screen 4th Generation wRfx: NONREACTIVE

## 2023-02-15 ENCOUNTER — Encounter: Payer: Medicaid Other | Admitting: Family Medicine

## 2023-02-22 ENCOUNTER — Encounter: Payer: Self-pay | Admitting: Family Medicine

## 2023-02-22 ENCOUNTER — Other Ambulatory Visit: Payer: Self-pay | Admitting: Family Medicine

## 2023-02-22 ENCOUNTER — Other Ambulatory Visit (HOSPITAL_COMMUNITY)
Admission: RE | Admit: 2023-02-22 | Discharge: 2023-02-22 | Disposition: A | Discharge status: Home / Self Care | Payer: Medicaid Other | Source: Ambulatory Visit | Attending: Family Medicine | Admitting: Family Medicine

## 2023-02-22 ENCOUNTER — Ambulatory Visit (INDEPENDENT_AMBULATORY_CARE_PROVIDER_SITE_OTHER): Payer: Medicaid Other | Admitting: Family Medicine

## 2023-02-22 VITALS — BP 121/75 | HR 77 | Temp 98.6°F | Ht 70.0 in | Wt 278.0 lb

## 2023-02-22 DIAGNOSIS — B009 Herpesviral infection, unspecified: Secondary | ICD-10-CM | POA: Insufficient documentation

## 2023-02-22 DIAGNOSIS — Z79899 Other long term (current) drug therapy: Secondary | ICD-10-CM | POA: Diagnosis not present

## 2023-02-22 DIAGNOSIS — Z0001 Encounter for general adult medical examination with abnormal findings: Secondary | ICD-10-CM

## 2023-02-22 DIAGNOSIS — Z7252 High risk homosexual behavior: Secondary | ICD-10-CM

## 2023-02-22 DIAGNOSIS — Z8619 Personal history of other infectious and parasitic diseases: Secondary | ICD-10-CM

## 2023-02-22 DIAGNOSIS — Z0000 Encounter for general adult medical examination without abnormal findings: Secondary | ICD-10-CM

## 2023-02-22 DIAGNOSIS — I10 Essential (primary) hypertension: Secondary | ICD-10-CM

## 2023-02-22 DIAGNOSIS — H5000 Unspecified esotropia: Secondary | ICD-10-CM | POA: Diagnosis not present

## 2023-02-22 LAB — BAYER DCA HB A1C WAIVED: HB A1C (BAYER DCA - WAIVED): 6.1 % — ABNORMAL HIGH (ref 4.8–5.6)

## 2023-02-22 MED ORDER — VALACYCLOVIR HCL 1 G PO TABS: 1000.0000 mg | ORAL_TABLET | Freq: Every day | ORAL | 3 refills | Status: DC

## 2023-02-22 MED ORDER — DESCOVY 200-25 MG PO TABS: 1.0000 | ORAL_TABLET | Freq: Every day | ORAL | 0 refills | Status: DC

## 2023-02-22 MED ORDER — AMLODIPINE BESYLATE 2.5 MG PO TABS: 2.5000 mg | ORAL_TABLET | Freq: Every day | ORAL | 3 refills | Status: DC

## 2023-02-22 NOTE — Patient Instructions (Signed)
Health Maintenance, Male Adopting a healthy lifestyle and getting preventive care are important in promoting health and wellness. Ask your health care provider about: The right schedule for you to have regular tests and exams. Things you can do on your own to prevent diseases and keep yourself healthy. What should I know about diet, weight, and exercise? Eat a healthy diet  Eat a diet that includes plenty of vegetables, fruits, low-fat dairy products, and lean protein. Do not eat a lot of foods that are high in solid fats, added sugars, or sodium. Maintain a healthy weight Body mass index (BMI) is a measurement that can be used to identify possible weight problems. It estimates body fat based on height and weight. Your health care provider can help determine your BMI and help you achieve or maintain a healthy weight. Get regular exercise Get regular exercise. This is one of the most important things you can do for your health. Most adults should: Exercise for at least 150 minutes each week. The exercise should increase your heart rate and make you sweat (moderate-intensity exercise). Do strengthening exercises at least twice a week. This is in addition to the moderate-intensity exercise. Spend less time sitting. Even light physical activity can be beneficial. Watch cholesterol and blood lipids Have your blood tested for lipids and cholesterol at 31 years of age, then have this test every 5 years. You may need to have your cholesterol levels checked more often if: Your lipid or cholesterol levels are high. You are older than 31 years of age. You are at high risk for heart disease. What should I know about cancer screening? Many types of cancers can be detected early and may often be prevented. Depending on your health history and family history, you may need to have cancer screening at various ages. This may include screening for: Colorectal cancer. Prostate cancer. Skin cancer. Lung  cancer. What should I know about heart disease, diabetes, and high blood pressure? Blood pressure and heart disease High blood pressure causes heart disease and increases the risk of stroke. This is more likely to develop in people who have high blood pressure readings or are overweight. Talk with your health care provider about your target blood pressure readings. Have your blood pressure checked: Every 3-5 years if you are 18-39 years of age. Every year if you are 40 years old or older. If you are between the ages of 65 and 75 and are a current or former smoker, ask your health care provider if you should have a one-time screening for abdominal aortic aneurysm (AAA). Diabetes Have regular diabetes screenings. This checks your fasting blood sugar level. Have the screening done: Once every three years after age 45 if you are at a normal weight and have a low risk for diabetes. More often and at a younger age if you are overweight or have a high risk for diabetes. What should I know about preventing infection? Hepatitis B If you have a higher risk for hepatitis B, you should be screened for this virus. Talk with your health care provider to find out if you are at risk for hepatitis B infection. Hepatitis C Blood testing is recommended for: Everyone born from 1945 through 1965. Anyone with known risk factors for hepatitis C. Sexually transmitted infections (STIs) You should be screened each year for STIs, including gonorrhea and chlamydia, if: You are sexually active and are younger than 31 years of age. You are older than 31 years of age and your   health care provider tells you that you are at risk for this type of infection. Your sexual activity has changed since you were last screened, and you are at increased risk for chlamydia or gonorrhea. Ask your health care provider if you are at risk. Ask your health care provider about whether you are at high risk for HIV. Your health care provider  may recommend a prescription medicine to help prevent HIV infection. If you choose to take medicine to prevent HIV, you should first get tested for HIV. You should then be tested every 3 months for as long as you are taking the medicine. Follow these instructions at home: Alcohol use Do not drink alcohol if your health care provider tells you not to drink. If you drink alcohol: Limit how much you have to 0-2 drinks a day. Know how much alcohol is in your drink. In the [place] one drink equals one 12 oz bottle of beer (355 mL), one 5 oz glass of wine (148 mL), or one 1 oz glass of hard liquor (44 mL). Lifestyle Do not use any products that contain nicotine or tobacco. These products include cigarettes, chewing tobacco, and vaping devices, such as e-cigarettes. If you need help quitting, ask your health care provider. Do not use street drugs. Do not share needles. Ask your health care provider for help if you need support or information about quitting drugs. General instructions Schedule regular health, dental, and eye exams. Stay current with your vaccines. Tell your health care provider if: You often feel depressed. You have ever been abused or do not feel safe at home. Summary Adopting a healthy lifestyle and getting preventive care are important in promoting health and wellness. Follow your health care provider's instructions about healthy diet, exercising, and getting tested or screened for diseases. Follow your health care provider's instructions on monitoring your cholesterol and blood pressure. This information is not intended to replace advice given to you by your health care provider. Make sure you discuss any questions you have with your health care provider. Document Revised: 05/03/2021 Document Reviewed: 05/03/2021 Elsevier Patient Education  2023 Elsevier Inc.  
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
## 2023-02-23 LAB — CMP14+EGFR
ALT: 40 IU/L (ref 0–44)
AST: 24 IU/L (ref 0–40)
Albumin/Globulin Ratio: 1.7 (ref 1.2–2.2)
Albumin: 4.8 g/dL (ref 4.3–5.2)
Alkaline Phosphatase: 85 IU/L (ref 44–121)
BUN/Creatinine Ratio: 12 (ref 9–20)
BUN: 10 mg/dL (ref 6–20)
Bilirubin Total: 0.3 mg/dL (ref 0.0–1.2)
CO2: 22 mmol/L (ref 20–29)
Calcium: 10.1 mg/dL (ref 8.7–10.2)
Chloride: 102 mmol/L (ref 96–106)
Creatinine, Ser: 0.84 mg/dL (ref 0.76–1.27)
Globulin, Total: 2.8 g/dL (ref 1.5–4.5)
Glucose: 89 mg/dL (ref 70–99)
Potassium: 4.5 mmol/L (ref 3.5–5.2)
Sodium: 140 mmol/L (ref 134–144)
Total Protein: 7.6 g/dL (ref 6.0–8.5)
eGFR: 120 mL/min/{1.73_m2} (ref >59)

## 2023-02-23 LAB — HIV ANTIBODY (ROUTINE TESTING W REFLEX): HIV Screen 4th Generation wRfx: NONREACTIVE

## 2023-02-23 LAB — LIPID PANEL
Chol/HDL Ratio: 5.6 ratio — ABNORMAL HIGH (ref 0.0–5.0)
Cholesterol, Total: 211 mg/dL — ABNORMAL HIGH (ref 100–199)
HDL: 38 mg/dL — ABNORMAL LOW (ref >39)
LDL Chol Calc (NIH): 138 mg/dL — ABNORMAL HIGH (ref 0–99)
Triglycerides: 193 mg/dL — ABNORMAL HIGH (ref 0–149)
VLDL Cholesterol Cal: 35 mg/dL (ref 5–40)

## 2023-02-23 LAB — HEPATITIS C ANTIBODY: Hep C Virus Ab: NONREACTIVE

## 2023-02-24 ENCOUNTER — Other Ambulatory Visit: Payer: Self-pay | Admitting: Family Medicine

## 2023-02-24 DIAGNOSIS — A749 Chlamydial infection, unspecified: Secondary | ICD-10-CM

## 2023-02-24 LAB — GC/CHLAMYDIA PROBE AMP (~~LOC~~) NOT AT ARMC
Chlamydia: NEGATIVE
Chlamydia: NEGATIVE
Chlamydia: POSITIVE — AB
Comment: NEGATIVE
Comment: NEGATIVE
Comment: NEGATIVE
Comment: NORMAL
Comment: NORMAL
Comment: NORMAL
Neisseria Gonorrhea: NEGATIVE
Neisseria Gonorrhea: NEGATIVE
Neisseria Gonorrhea: NEGATIVE

## 2023-02-24 MED ORDER — DOXYCYCLINE HYCLATE 100 MG PO TABS: 100.0000 mg | ORAL_TABLET | Freq: Two times a day (BID) | ORAL | 0 refills | Status: AC

## 2023-03-21 ENCOUNTER — Other Ambulatory Visit (HOSPITAL_COMMUNITY)
Admission: RE | Admit: 2023-03-21 | Discharge: 2023-03-21 | Disposition: A | Discharge status: Home / Self Care | Payer: Medicaid Other | Source: Ambulatory Visit | Attending: Family Medicine | Admitting: Family Medicine

## 2023-03-21 ENCOUNTER — Encounter: Payer: Self-pay | Admitting: Family Medicine

## 2023-03-21 ENCOUNTER — Ambulatory Visit (INDEPENDENT_AMBULATORY_CARE_PROVIDER_SITE_OTHER): Payer: Medicaid Other | Admitting: Family Medicine

## 2023-03-21 VITALS — BP 123/81 | HR 70 | Temp 98.6°F | Ht 70.0 in | Wt 261.0 lb

## 2023-03-21 DIAGNOSIS — Z8619 Personal history of other infectious and parasitic diseases: Secondary | ICD-10-CM | POA: Diagnosis not present
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
## 2023-03-22 LAB — CYTOLOGY, (ORAL, ANAL, URETHRAL) ANCILLARY ONLY
Chlamydia: NEGATIVE
Comment: NEGATIVE
Comment: NEGATIVE
Comment: NORMAL
Neisseria Gonorrhea: NEGATIVE
Trichomonas: NEGATIVE

## 2023-03-22 LAB — LIPID PANEL
Chol/HDL Ratio: 5.7 ratio — ABNORMAL HIGH (ref 0.0–5.0)
Cholesterol, Total: 178 mg/dL (ref 100–199)
HDL: 31 mg/dL — ABNORMAL LOW (ref >39)
LDL Chol Calc (NIH): 119 mg/dL — ABNORMAL HIGH (ref 0–99)
Triglycerides: 158 mg/dL — ABNORMAL HIGH (ref 0–149)
VLDL Cholesterol Cal: 28 mg/dL (ref 5–40)

## 2023-03-22 LAB — CMP14+EGFR
ALT: 28 IU/L (ref 0–44)
AST: 21 IU/L (ref 0–40)
Albumin/Globulin Ratio: 1.6 (ref 1.2–2.2)
Albumin: 4.6 g/dL (ref 4.3–5.2)
Alkaline Phosphatase: 76 IU/L (ref 44–121)
BUN/Creatinine Ratio: 9 (ref 9–20)
BUN: 8 mg/dL (ref 6–20)
Bilirubin Total: 0.4 mg/dL (ref 0.0–1.2)
CO2: 20 mmol/L (ref 20–29)
Calcium: 9.8 mg/dL (ref 8.7–10.2)
Chloride: 102 mmol/L (ref 96–106)
Creatinine, Ser: 0.9 mg/dL (ref 0.76–1.27)
Globulin, Total: 2.8 g/dL (ref 1.5–4.5)
Glucose: 99 mg/dL (ref 70–99)
Potassium: 4.1 mmol/L (ref 3.5–5.2)
Sodium: 139 mmol/L (ref 134–144)
Total Protein: 7.4 g/dL (ref 6.0–8.5)
eGFR: 118 mL/min/{1.73_m2} (ref >59)

## 2023-03-22 LAB — HIV ANTIBODY (ROUTINE TESTING W REFLEX): HIV Screen 4th Generation wRfx: NONREACTIVE

## 2023-03-23 LAB — CYTOLOGY, (ORAL, ANAL, URETHRAL) ANCILLARY ONLY
Chlamydia: NEGATIVE
Chlamydia: NEGATIVE
Comment: NEGATIVE
Comment: NEGATIVE
Comment: NEGATIVE
Comment: NEGATIVE
Comment: NORMAL
Comment: NORMAL
Neisseria Gonorrhea: NEGATIVE
Neisseria Gonorrhea: NEGATIVE
Trichomonas: NEGATIVE
Trichomonas: NEGATIVE

## 2023-04-10 ENCOUNTER — Ambulatory Visit: Payer: Medicaid Other | Admitting: Family Medicine

## 2023-04-24 ENCOUNTER — Other Ambulatory Visit: Payer: Self-pay | Admitting: Family Medicine

## 2023-04-24 ENCOUNTER — Telehealth: Payer: Self-pay | Admitting: Family Medicine

## 2023-04-24 DIAGNOSIS — Z79899 Other long term (current) drug therapy: Secondary | ICD-10-CM

## 2023-04-24 DIAGNOSIS — Z7252 High risk homosexual behavior: Secondary | ICD-10-CM

## 2023-04-24 MED ORDER — DESCOVY 200-25 MG PO TABS: 1.0000 | ORAL_TABLET | Freq: Every day | ORAL | 0 refills | Status: DC

## 2023-04-24 NOTE — Telephone Encounter (Signed)
He will need 1 additional 30 day supply to get him to his appt in June.  I will send in.

## 2023-04-24 NOTE — Telephone Encounter (Signed)
Pt asking why emtricitabine-tenofovir AF (DESCOVY) 200-25 MG tablet  was not sent in for 3 mo supply. Does he need to come in for med refill? Please call back

## 2023-05-26 ENCOUNTER — Encounter: Payer: Self-pay | Admitting: Family Medicine

## 2023-05-26 ENCOUNTER — Ambulatory Visit: Payer: Medicaid Other | Admitting: Family Medicine

## 2023-05-26 ENCOUNTER — Other Ambulatory Visit (HOSPITAL_COMMUNITY)
Admission: RE | Admit: 2023-05-26 | Discharge: 2023-05-26 | Disposition: A | Discharge status: Home / Self Care | Payer: Medicaid Other | Source: Ambulatory Visit | Attending: Family Medicine | Admitting: Family Medicine

## 2023-05-26 VITALS — BP 127/85 | HR 65 | Temp 98.6°F | Ht 69.0 in | Wt 250.0 lb

## 2023-05-26 DIAGNOSIS — Z8619 Personal history of other infectious and parasitic diseases: Secondary | ICD-10-CM | POA: Diagnosis not present

## 2023-05-26 DIAGNOSIS — Z79899 Other long term (current) drug therapy: Secondary | ICD-10-CM | POA: Insufficient documentation

## 2023-05-26 DIAGNOSIS — Z7252 High risk homosexual behavior: Secondary | ICD-10-CM | POA: Diagnosis not present

## 2023-05-26 MED ORDER — DESCOVY 200-25 MG PO TABS
1.0000 | ORAL_TABLET | Freq: Every day | ORAL | 0 refills | Status: DC
Start: 2023-05-26 — End: 2023-08-29
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
## 2023-05-27 LAB — BASIC METABOLIC PANEL
BUN/Creatinine Ratio: 11 (ref 9–20)
BUN: 9 mg/dL (ref 6–20)
CO2: 23 mmol/L (ref 20–29)
Calcium: 9.7 mg/dL (ref 8.7–10.2)
Chloride: 104 mmol/L (ref 96–106)
Creatinine, Ser: 0.84 mg/dL (ref 0.76–1.27)
Glucose: 89 mg/dL (ref 70–99)
Potassium: 4.5 mmol/L (ref 3.5–5.2)
Sodium: 141 mmol/L (ref 134–144)
eGFR: 120 mL/min/{1.73_m2} (ref >59)

## 2023-05-27 LAB — RPR: RPR Ser Ql: REACTIVE — AB

## 2023-05-27 LAB — RPR, QUANT+TP ABS (REFLEX)
Rapid Plasma Reagin, Quant: 1:2 {titer} — ABNORMAL HIGH
T Pallidum Abs: REACTIVE — AB

## 2023-05-27 LAB — HIV ANTIBODY (ROUTINE TESTING W REFLEX): HIV Screen 4th Generation wRfx: NONREACTIVE

## 2023-05-30 LAB — CYTOLOGY, (ORAL, ANAL, URETHRAL) ANCILLARY ONLY
Chlamydia: NEGATIVE
Chlamydia: NEGATIVE
Chlamydia: NEGATIVE
Comment: NEGATIVE
Comment: NEGATIVE
Comment: NORMAL
Comment: NEGATIVE
Comment: NEGATIVE
Comment: NEGATIVE
Comment: NEGATIVE
Comment: NORMAL
Comment: NORMAL
Neisseria Gonorrhea: NEGATIVE
Neisseria Gonorrhea: NEGATIVE
Neisseria Gonorrhea: NEGATIVE
Trichomonas: NEGATIVE
Trichomonas: NEGATIVE
Trichomonas: NEGATIVE

## 2023-08-29 ENCOUNTER — Other Ambulatory Visit (HOSPITAL_COMMUNITY)
Admission: RE | Admit: 2023-08-29 | Discharge: 2023-08-29 | Disposition: A | Discharge status: Home / Self Care | Payer: Medicaid Other | Source: Ambulatory Visit | Attending: Family Medicine | Admitting: Family Medicine

## 2023-08-29 ENCOUNTER — Encounter: Payer: Self-pay | Admitting: Family Medicine

## 2023-08-29 ENCOUNTER — Ambulatory Visit: Payer: Medicaid Other | Admitting: Family Medicine

## 2023-08-29 VITALS — BP 142/83 | HR 60 | Temp 98.3°F | Ht 69.0 in | Wt 238.4 lb

## 2023-08-29 DIAGNOSIS — Z79899 Other long term (current) drug therapy: Secondary | ICD-10-CM | POA: Insufficient documentation

## 2023-08-29 DIAGNOSIS — L700 Acne vulgaris: Secondary | ICD-10-CM | POA: Diagnosis not present

## 2023-08-29 DIAGNOSIS — Z7252 High risk homosexual behavior: Secondary | ICD-10-CM

## 2023-08-29 MED ORDER — CLINDAMYCIN PHOSPHATE 1 % EX SWAB: CUTANEOUS | 3 refills | Status: AC

## 2023-08-29 MED ORDER — DESCOVY 200-25 MG PO TABS
1.0000 | ORAL_TABLET | Freq: Every day | ORAL | 0 refills | Status: DC
Start: 2023-08-29 — End: 2023-12-15
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
## 2023-08-30 LAB — CMP14+EGFR
ALT: 20 IU/L (ref 0–44)
AST: 18 IU/L (ref 0–40)
Albumin: 4.3 g/dL (ref 4.3–5.2)
Alkaline Phosphatase: 83 IU/L (ref 44–121)
BUN/Creatinine Ratio: 12 (ref 9–20)
BUN: 11 mg/dL (ref 6–20)
Bilirubin Total: 0.2 mg/dL (ref 0.0–1.2)
CO2: 23 mmol/L (ref 20–29)
Calcium: 10 mg/dL (ref 8.7–10.2)
Chloride: 101 mmol/L (ref 96–106)
Creatinine, Ser: 0.89 mg/dL (ref 0.76–1.27)
Globulin, Total: 3 g/dL (ref 1.5–4.5)
Glucose: 86 mg/dL (ref 70–99)
Potassium: 4 mmol/L (ref 3.5–5.2)
Sodium: 139 mmol/L (ref 134–144)
Total Protein: 7.3 g/dL (ref 6.0–8.5)
eGFR: 118 mL/min/{1.73_m2} (ref >59)

## 2023-08-30 LAB — HEPATITIS C ANTIBODY: Hep C Virus Ab: NONREACTIVE

## 2023-08-30 LAB — HIV ANTIBODY (ROUTINE TESTING W REFLEX): HIV Screen 4th Generation wRfx: NONREACTIVE

## 2023-08-30 LAB — RPR, QUANT+TP ABS (REFLEX)
Rapid Plasma Reagin, Quant: 1:1 {titer} — ABNORMAL HIGH
T Pallidum Abs: REACTIVE — AB

## 2023-08-30 LAB — RPR: RPR Ser Ql: REACTIVE — AB

## 2023-08-31 LAB — CYTOLOGY, (ORAL, ANAL, URETHRAL) ANCILLARY ONLY
Chlamydia: NEGATIVE
Chlamydia: NEGATIVE
Chlamydia: NEGATIVE
Comment: NEGATIVE
Comment: NEGATIVE
Comment: NEGATIVE
Comment: NEGATIVE
Comment: NEGATIVE
Comment: NEGATIVE
Comment: NORMAL
Comment: NORMAL
Comment: NORMAL
Neisseria Gonorrhea: NEGATIVE
Neisseria Gonorrhea: NEGATIVE
Neisseria Gonorrhea: NEGATIVE
Trichomonas: NEGATIVE
Trichomonas: NEGATIVE
Trichomonas: NEGATIVE

## 2023-12-15 ENCOUNTER — Other Ambulatory Visit (HOSPITAL_COMMUNITY)
Admission: RE | Admit: 2023-12-15 | Discharge: 2023-12-15 | Disposition: A | Discharge status: Home / Self Care | Payer: Medicaid Other | Source: Ambulatory Visit | Attending: Family Medicine | Admitting: Family Medicine

## 2023-12-15 ENCOUNTER — Ambulatory Visit (INDEPENDENT_AMBULATORY_CARE_PROVIDER_SITE_OTHER): Payer: Medicaid Other | Admitting: Family Medicine

## 2023-12-15 ENCOUNTER — Encounter: Payer: Self-pay | Admitting: Family Medicine

## 2023-12-15 VITALS — BP 126/77 | HR 100 | Temp 98.7°F | Ht 69.0 in | Wt 236.0 lb

## 2023-12-15 DIAGNOSIS — I10 Essential (primary) hypertension: Secondary | ICD-10-CM | POA: Diagnosis not present

## 2023-12-15 DIAGNOSIS — Z8619 Personal history of other infectious and parasitic diseases: Secondary | ICD-10-CM | POA: Insufficient documentation

## 2023-12-15 DIAGNOSIS — Z79899 Other long term (current) drug therapy: Secondary | ICD-10-CM

## 2023-12-15 DIAGNOSIS — Z7252 High risk homosexual behavior: Secondary | ICD-10-CM | POA: Insufficient documentation

## 2023-12-15 DIAGNOSIS — B009 Herpesviral infection, unspecified: Secondary | ICD-10-CM

## 2023-12-15 MED ORDER — VALACYCLOVIR HCL 1 G PO TABS: 1000.0000 mg | ORAL_TABLET | Freq: Every day | ORAL | 3 refills | Status: DC

## 2023-12-15 MED ORDER — DESCOVY 200-25 MG PO TABS: 1.0000 | ORAL_TABLET | Freq: Every day | ORAL | 0 refills | Status: DC

## 2023-12-15 MED ORDER — AMLODIPINE BESYLATE 2.5 MG PO TABS: 2.5000 mg | ORAL_TABLET | Freq: Every day | ORAL | 3 refills | Status: DC
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
## 2023-12-18 ENCOUNTER — Encounter: Payer: Self-pay | Admitting: Family Medicine

## 2023-12-18 LAB — BASIC METABOLIC PANEL
BUN/Creatinine Ratio: 13 (ref 9–20)
BUN: 12 mg/dL (ref 6–20)
CO2: 24 mmol/L (ref 20–29)
Calcium: 9.5 mg/dL (ref 8.7–10.2)
Chloride: 101 mmol/L (ref 96–106)
Creatinine, Ser: 0.91 mg/dL (ref 0.76–1.27)
Glucose: 83 mg/dL (ref 70–99)
Potassium: 4.3 mmol/L (ref 3.5–5.2)
Sodium: 138 mmol/L (ref 134–144)
eGFR: 116 mL/min/{1.73_m2} (ref >59)

## 2023-12-18 LAB — HIV ANTIBODY (ROUTINE TESTING W REFLEX): HIV Screen 4th Generation wRfx: NONREACTIVE

## 2023-12-18 LAB — LIPID PANEL W/O CHOL/HDL RATIO

## 2023-12-18 LAB — RPR, QUANT+TP ABS (REFLEX): Rapid Plasma Reagin, Quant: 1:1 {titer} — ABNORMAL HIGH

## 2023-12-18 LAB — RPR: RPR Ser Ql: REACTIVE — AB

## 2023-12-19 LAB — SPECIMEN STATUS REPORT

## 2023-12-19 LAB — CYTOLOGY, (ORAL, ANAL, URETHRAL) ANCILLARY ONLY
Chlamydia: NEGATIVE
Chlamydia: NEGATIVE
Chlamydia: NEGATIVE
Comment: NEGATIVE
Comment: NEGATIVE
Comment: NEGATIVE
Comment: NEGATIVE
Comment: NORMAL
Comment: NORMAL
Comment: NORMAL
Neisseria Gonorrhea: NEGATIVE
Neisseria Gonorrhea: NEGATIVE
Neisseria Gonorrhea: NEGATIVE
Trichomonas: NEGATIVE

## 2023-12-19 LAB — LIPID PANEL W/O CHOL/HDL RATIO
Cholesterol, Total: 188 mg/dL (ref 100–199)
HDL: 39 mg/dL — ABNORMAL LOW (ref >39)
LDL Chol Calc (NIH): 122 mg/dL — ABNORMAL HIGH (ref 0–99)
Triglycerides: 151 mg/dL — ABNORMAL HIGH (ref 0–149)
VLDL Cholesterol Cal: 27 mg/dL (ref 5–40)
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
## 2024-03-21 ENCOUNTER — Other Ambulatory Visit: Payer: Self-pay | Admitting: Family Medicine

## 2024-03-21 DIAGNOSIS — Z7252 High risk homosexual behavior: Secondary | ICD-10-CM

## 2024-03-21 DIAGNOSIS — Z79899 Other long term (current) drug therapy: Secondary | ICD-10-CM

## 2024-03-22 NOTE — Telephone Encounter (Signed)
 Pt has enough until his appt on 03/25/24

## 2024-03-22 NOTE — Telephone Encounter (Signed)
 Please offer him a same day visit today. He has to be seen to get refills. Is he totally out before Monday?

## 2024-03-25 ENCOUNTER — Ambulatory Visit (INDEPENDENT_AMBULATORY_CARE_PROVIDER_SITE_OTHER): Payer: Medicaid Other | Admitting: Family Medicine

## 2024-03-25 ENCOUNTER — Other Ambulatory Visit (HOSPITAL_COMMUNITY)
Admission: RE | Admit: 2024-03-25 | Discharge: 2024-03-25 | Disposition: A | Discharge status: Home / Self Care | Source: Ambulatory Visit | Attending: Family Medicine | Admitting: Family Medicine

## 2024-03-25 ENCOUNTER — Encounter: Payer: Self-pay | Admitting: Family Medicine

## 2024-03-25 VITALS — BP 115/68 | HR 86 | Temp 98.8°F | Ht 69.0 in | Wt 242.0 lb

## 2024-03-25 DIAGNOSIS — R7303 Prediabetes: Secondary | ICD-10-CM

## 2024-03-25 DIAGNOSIS — Z7252 High risk homosexual behavior: Secondary | ICD-10-CM | POA: Diagnosis not present

## 2024-03-25 DIAGNOSIS — I10 Essential (primary) hypertension: Secondary | ICD-10-CM | POA: Diagnosis not present

## 2024-03-25 DIAGNOSIS — Z79899 Other long term (current) drug therapy: Secondary | ICD-10-CM | POA: Diagnosis not present

## 2024-03-25 DIAGNOSIS — Z8619 Personal history of other infectious and parasitic diseases: Secondary | ICD-10-CM

## 2024-03-25 LAB — BAYER DCA HB A1C WAIVED: HB A1C (BAYER DCA - WAIVED): 5.4 % (ref 4.8–5.6)

## 2024-03-25 MED ORDER — DESCOVY 200-25 MG PO TABS: 1.0000 | ORAL_TABLET | Freq: Every day | ORAL | 0 refills | Status: DC
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
## 2024-03-26 ENCOUNTER — Encounter: Payer: Self-pay | Admitting: Family Medicine

## 2024-03-26 LAB — HIV ANTIBODY (ROUTINE TESTING W REFLEX): HIV Screen 4th Generation wRfx: NONREACTIVE

## 2024-03-26 LAB — CMP14+EGFR
ALT: 21 IU/L (ref 0–44)
AST: 17 IU/L (ref 0–40)
Albumin: 4.5 g/dL (ref 4.1–5.1)
Alkaline Phosphatase: 85 IU/L (ref 44–121)
BUN/Creatinine Ratio: 14 (ref 9–20)
BUN: 12 mg/dL (ref 6–20)
Bilirubin Total: <0.2 mg/dL (ref 0.0–1.2)
CO2: 24 mmol/L (ref 20–29)
Calcium: 10 mg/dL (ref 8.7–10.2)
Chloride: 102 mmol/L (ref 96–106)
Creatinine, Ser: 0.85 mg/dL (ref 0.76–1.27)
Globulin, Total: 3.1 g/dL (ref 1.5–4.5)
Glucose: 87 mg/dL (ref 70–99)
Potassium: 4.6 mmol/L (ref 3.5–5.2)
Sodium: 139 mmol/L (ref 134–144)
Total Protein: 7.6 g/dL (ref 6.0–8.5)
eGFR: 119 mL/min/{1.73_m2} (ref >59)

## 2024-03-26 LAB — CBC
Hematocrit: 44 % (ref 37.5–51.0)
Hemoglobin: 14.7 g/dL (ref 13.0–17.7)
MCH: 28.9 pg (ref 26.6–33.0)
MCHC: 33.4 g/dL (ref 31.5–35.7)
MCV: 87 fL (ref 79–97)
Platelets: 211 10*3/uL (ref 150–450)
RBC: 5.08 x10E6/uL (ref 4.14–5.80)
RDW: 13.1 % (ref 11.6–15.4)
WBC: 6.1 10*3/uL (ref 3.4–10.8)

## 2024-03-26 LAB — RPR: RPR Ser Ql: REACTIVE — AB

## 2024-03-26 LAB — RPR, QUANT+TP ABS (REFLEX)
Rapid Plasma Reagin, Quant: 1:2 {titer} — ABNORMAL HIGH
T Pallidum Abs: REACTIVE — AB

## 2024-03-26 LAB — HEPATITIS C ANTIBODY: Hep C Virus Ab: NONREACTIVE

## 2024-03-27 ENCOUNTER — Telehealth: Payer: Self-pay

## 2024-03-27 LAB — CYTOLOGY, (ORAL, ANAL, URETHRAL) ANCILLARY ONLY
Chlamydia: NEGATIVE
Chlamydia: NEGATIVE
Chlamydia: NEGATIVE
Comment: NEGATIVE
Comment: NEGATIVE
Comment: NORMAL
Comment: NORMAL
Comment: NEGATIVE
Comment: NEGATIVE
Comment: NORMAL
Neisseria Gonorrhea: NEGATIVE
Neisseria Gonorrhea: NEGATIVE
Neisseria Gonorrhea: NEGATIVE
Trichomonas: NEGATIVE

## 2024-03-27 NOTE — Telephone Encounter (Signed)
 [facility] called states that they received an anal swab on the patient. They can perform the GC/chlamydia testing but are not able to do the trichomoniasis testing with this swab - for that test it must be a urethral swab.

## 2024-03-27 NOTE — Telephone Encounter (Signed)
 No problem. They should have 3 swabs (oral, anal and urethral). Just perform it on whichever they are able to

## 2024-06-19 ENCOUNTER — Other Ambulatory Visit (HOSPITAL_COMMUNITY)
Admission: RE | Admit: 2024-06-19 | Discharge: 2024-06-19 | Disposition: A | Discharge status: Home / Self Care | Source: Ambulatory Visit | Attending: Family Medicine | Admitting: Family Medicine

## 2024-06-19 ENCOUNTER — Encounter: Payer: Self-pay | Admitting: Family Medicine

## 2024-06-19 ENCOUNTER — Ambulatory Visit: Admitting: Family Medicine

## 2024-06-19 ENCOUNTER — Ambulatory Visit: Payer: Self-pay | Admitting: Family Medicine

## 2024-06-19 VITALS — BP 115/67 | HR 61 | Temp 98.4°F | Ht 69.0 in | Wt 237.0 lb

## 2024-06-19 DIAGNOSIS — Z79899 Other long term (current) drug therapy: Secondary | ICD-10-CM

## 2024-06-19 DIAGNOSIS — Z8619 Personal history of other infectious and parasitic diseases: Secondary | ICD-10-CM

## 2024-06-19 DIAGNOSIS — R7303 Prediabetes: Secondary | ICD-10-CM

## 2024-06-19 DIAGNOSIS — I10 Essential (primary) hypertension: Secondary | ICD-10-CM

## 2024-06-19 DIAGNOSIS — E782 Mixed hyperlipidemia: Secondary | ICD-10-CM | POA: Diagnosis not present

## 2024-06-19 DIAGNOSIS — Z7252 High risk homosexual behavior: Secondary | ICD-10-CM | POA: Insufficient documentation

## 2024-06-19 DIAGNOSIS — B009 Herpesviral infection, unspecified: Secondary | ICD-10-CM

## 2024-06-19 DIAGNOSIS — Z789 Other specified health status: Secondary | ICD-10-CM

## 2024-06-19 DIAGNOSIS — Z23 Encounter for immunization: Secondary | ICD-10-CM

## 2024-06-19 LAB — BAYER DCA HB A1C WAIVED: HB A1C (BAYER DCA - WAIVED): 5.3 % (ref 4.8–5.6)

## 2024-06-19 MED ORDER — DESCOVY 200-25 MG PO TABS: 1.0000 | ORAL_TABLET | Freq: Every day | ORAL | 0 refills | Status: DC

## 2024-06-19 MED ORDER — AMLODIPINE BESYLATE 2.5 MG PO TABS: 2.5000 mg | ORAL_TABLET | Freq: Every day | ORAL | 3 refills | Status: AC

## 2024-06-19 MED ORDER — VALACYCLOVIR HCL 1 G PO TABS: 1000.0000 mg | ORAL_TABLET | Freq: Every day | ORAL | 3 refills | Status: AC

## 2024-06-19 NOTE — Addendum Note (Signed)
 Addended by: LEIGH TYE CROME on: 06/19/2024 01:38 PM   Modules accepted: Orders
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
## 2024-06-20 DIAGNOSIS — F17210 Nicotine dependence, cigarettes, uncomplicated: Secondary | ICD-10-CM | POA: Diagnosis not present

## 2024-06-20 DIAGNOSIS — I10 Essential (primary) hypertension: Secondary | ICD-10-CM | POA: Diagnosis not present

## 2024-06-20 DIAGNOSIS — Z79899 Other long term (current) drug therapy: Secondary | ICD-10-CM | POA: Diagnosis not present

## 2024-06-20 DIAGNOSIS — S8991XA Unspecified injury of right lower leg, initial encounter: Secondary | ICD-10-CM | POA: Diagnosis not present

## 2024-06-20 DIAGNOSIS — M25561 Pain in right knee: Secondary | ICD-10-CM | POA: Diagnosis not present

## 2024-06-20 LAB — BASIC METABOLIC PANEL WITH GFR
BUN/Creatinine Ratio: 12 (ref 9–20)
BUN: 10 mg/dL (ref 6–20)
CO2: 19 mmol/L — ABNORMAL LOW (ref 20–29)
Calcium: 9.6 mg/dL (ref 8.7–10.2)
Chloride: 100 mmol/L (ref 96–106)
Creatinine, Ser: 0.85 mg/dL (ref 0.76–1.27)
Glucose: 82 mg/dL (ref 70–99)
Potassium: 4.1 mmol/L (ref 3.5–5.2)
Sodium: 139 mmol/L (ref 134–144)
eGFR: 119 mL/min/{1.73_m2} (ref >59)

## 2024-06-20 LAB — CYTOLOGY, (ORAL, ANAL, URETHRAL) ANCILLARY ONLY
Comment: NEGATIVE
Neisseria Gonorrhea: NEGATIVE

## 2024-06-20 LAB — HIV ANTIBODY (ROUTINE TESTING W REFLEX): HIV Screen 4th Generation wRfx: NONREACTIVE

## 2024-06-20 LAB — RPR, QUANT+TP ABS (REFLEX)
Rapid Plasma Reagin, Quant: 1:1 {titer} — ABNORMAL HIGH
T Pallidum Abs: REACTIVE — AB

## 2024-06-20 LAB — RPR: RPR Ser Ql: REACTIVE — AB

## 2024-06-20 LAB — HEPATITIS B SURFACE ANTIBODY, QUANTITATIVE: Hepatitis B Surf Ab Quant: 172 m[IU]/mL

## 2024-06-20 LAB — LDL CHOLESTEROL, DIRECT: LDL Direct: 112 mg/dL — ABNORMAL HIGH (ref 0–99)

## 2024-06-21 LAB — CYTOLOGY, (ORAL, ANAL, URETHRAL) ANCILLARY ONLY
Chlamydia: NEGATIVE
Chlamydia: NEGATIVE
Chlamydia: NEGATIVE
Comment: NORMAL
Comment: NORMAL
Comment: NEGATIVE
Comment: NEGATIVE
Comment: NORMAL
Comment: NEGATIVE
Neisseria Gonorrhea: NEGATIVE
Neisseria Gonorrhea: NEGATIVE
Trichomonas: NEGATIVE

## 2024-09-20 ENCOUNTER — Other Ambulatory Visit (HOSPITAL_COMMUNITY)
Admission: RE | Admit: 2024-09-20 | Discharge: 2024-09-20 | Disposition: A | Source: Ambulatory Visit | Attending: Family Medicine | Admitting: Family Medicine

## 2024-09-20 ENCOUNTER — Encounter: Payer: Self-pay | Admitting: Family Medicine

## 2024-09-20 ENCOUNTER — Ambulatory Visit: Admitting: Family Medicine

## 2024-09-20 VITALS — BP 133/88 | HR 64 | Temp 98.4°F | Ht 69.0 in | Wt 239.1 lb

## 2024-09-20 DIAGNOSIS — Z8619 Personal history of other infectious and parasitic diseases: Secondary | ICD-10-CM | POA: Insufficient documentation

## 2024-09-20 DIAGNOSIS — Z23 Encounter for immunization: Secondary | ICD-10-CM

## 2024-09-20 DIAGNOSIS — Z7252 High risk homosexual behavior: Secondary | ICD-10-CM | POA: Insufficient documentation

## 2024-09-20 DIAGNOSIS — Z79899 Other long term (current) drug therapy: Secondary | ICD-10-CM

## 2024-09-20 DIAGNOSIS — L739 Follicular disorder, unspecified: Secondary | ICD-10-CM

## 2024-09-20 MED ORDER — MUPIROCIN 2 % EX OINT: 1.0000 | TOPICAL_OINTMENT | Freq: Two times a day (BID) | CUTANEOUS | 0 refills | Status: AC

## 2024-09-20 MED ORDER — DESCOVY 200-25 MG PO TABS: 1.0000 | ORAL_TABLET | Freq: Every day | ORAL | 0 refills | Status: DC
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
## 2024-09-23 ENCOUNTER — Ambulatory Visit: Payer: Self-pay | Admitting: Family Medicine

## 2024-09-24 LAB — BASIC METABOLIC PANEL WITH GFR
BUN/Creatinine Ratio: 16 (ref 9–20)
BUN: 15 mg/dL (ref 6–20)
CO2: 22 mmol/L (ref 20–29)
Calcium: 9.8 mg/dL (ref 8.7–10.2)
Chloride: 102 mmol/L (ref 96–106)
Creatinine, Ser: 0.91 mg/dL (ref 0.76–1.27)
Glucose: 92 mg/dL (ref 70–99)
Potassium: 4.1 mmol/L (ref 3.5–5.2)
Sodium: 138 mmol/L (ref 134–144)
eGFR: 116 mL/min/1.73 (ref >59)

## 2024-09-24 LAB — CYTOLOGY, (ORAL, ANAL, URETHRAL) ANCILLARY ONLY
Chlamydia: NEGATIVE
Chlamydia: NEGATIVE
Chlamydia: NEGATIVE
Comment: NEGATIVE
Comment: NEGATIVE
Comment: NEGATIVE
Comment: NORMAL
Comment: NORMAL
Comment: NORMAL
Neisseria Gonorrhea: NEGATIVE
Neisseria Gonorrhea: NEGATIVE
Neisseria Gonorrhea: NEGATIVE

## 2024-09-24 LAB — RPR: RPR Ser Ql: REACTIVE — AB

## 2024-09-24 LAB — HIV ANTIBODY (ROUTINE TESTING W REFLEX): HIV Screen 4th Generation wRfx: NONREACTIVE

## 2024-09-24 LAB — RPR, QUANT+TP ABS (REFLEX)
Rapid Plasma Reagin, Quant: 1:2 {titer} — ABNORMAL HIGH
T Pallidum Abs: REACTIVE — AB

## 2024-12-20 ENCOUNTER — Encounter: Payer: Self-pay | Admitting: Family Medicine

## 2024-12-20 ENCOUNTER — Other Ambulatory Visit: Payer: Self-pay

## 2024-12-20 ENCOUNTER — Ambulatory Visit: Payer: Self-pay | Admitting: Family Medicine

## 2024-12-20 VITALS — BP 124/79 | HR 56 | Temp 97.9°F | Ht 69.0 in | Wt 240.0 lb

## 2024-12-20 DIAGNOSIS — Z8619 Personal history of other infectious and parasitic diseases: Secondary | ICD-10-CM

## 2024-12-20 DIAGNOSIS — B009 Herpesviral infection, unspecified: Secondary | ICD-10-CM

## 2024-12-20 DIAGNOSIS — Z79899 Other long term (current) drug therapy: Secondary | ICD-10-CM | POA: Diagnosis not present

## 2024-12-20 DIAGNOSIS — Z7252 High risk homosexual behavior: Secondary | ICD-10-CM | POA: Diagnosis not present

## 2024-12-20 DIAGNOSIS — I10 Essential (primary) hypertension: Secondary | ICD-10-CM | POA: Diagnosis not present

## 2024-12-20 MED ORDER — DESCOVY 200-25 MG PO TABS: 1.0000 | ORAL_TABLET | Freq: Every day | ORAL | 0 refills | Status: AC

## 2024-12-20 NOTE — Progress Notes (Signed)
 "  Subjective: RR:EmZE PCP: Jolinda Norene HERO, DO YEP:Gerald [name] is a 33 y.o. male presenting to clinic today for:  Patient here for interval checkup for PrEP.  He reports that he has been compliant with Descovy  with no missed doses.  He has had 2 partners since our last visit, 1 that he actually had intercourse with and the other he just had French kissing.  He only received oral.  If this was unprotected.  He denies any sore throat, rashes, fatigue, penile discharge or lesions.  No rectal pain or bleeding.  Compliant with his Valtrex .  No HSV outbreaks  Reports blood pressure at home was 135/91 but he has been eating poorly over the last couple of days due to the holidays.  No chest pain, shortness of breath or visual disturbance reported.  Compliant with Norvasc    ROS: Per HPI  Allergies[1] Past Medical History:  Diagnosis Date   Chlamydia    Gonorrhea 2023   HSV-2 infection    Hypertension    Syphilis    treated at [facility]   Current Medications[2] Social History   Socioeconomic History   Marital status: Single    Spouse name: Not on file   Number of children: Not on file   Years of education: Not on file   Highest education level: 12th grade  Occupational History   Not on file  Tobacco Use   Smoking status: Every Day    Current packs/day: 0.50    Types: Cigarettes   Smokeless tobacco: Current  Vaping Use   Vaping status: Never Used  Substance and Sexual Activity   Alcohol use: No   Drug use: No   Sexual activity: Yes    Birth control/protection: Condom    Comment: gay  Other Topics Concern   Not on file  Social History Narrative   Resides alone.  Receives assistance for affordable housing   Medically disabled but patient unsure as to what the disability is exactly (disabled since 33 years old)   He has a dog named Tortilla whom he walks frequently   Dating but no steady relationship   Drives a moped.    MSM   Social Drivers of Health   Tobacco Use:  High Risk (09/20/2024)   Patient History    Smoking Tobacco Use: Every Day    Smokeless Tobacco Use: Current    Passive Exposure: Not on file  Financial Resource Strain: Medium Risk (12/18/2024)   Overall Financial Resource Strain (CARDIA)    Difficulty of Paying Living Expenses: Somewhat hard  Food Insecurity: Food Insecurity Present (12/18/2024)   Epic    Worried About Programme Researcher, Broadcasting/film/video in the Last Year: Sometimes true    Ran Out of Food in the Last Year: Sometimes true  Transportation Needs: No Transportation Needs (12/18/2024)   Epic    Lack of Transportation (Medical): No    Lack of Transportation (Non-Medical): No  Physical Activity: Sufficiently Active (12/18/2024)   Exercise Vital Sign    Days of Exercise per Week: 3 days    Minutes of Exercise per Session: 90 min  Stress: No Stress Concern Present (12/18/2024)   [name] of Occupational Health - Occupational Stress Questionnaire    Feeling of Stress: Not at all  Social Connections: Moderately Integrated (12/18/2024)   Social Connection and Isolation Panel    Frequency of Communication with Friends and Family: More than three times a week    Frequency of Social Gatherings with Friends and Family: More  than three times a week    Attends Religious Services: 1 to 4 times per year    Active Member of Clubs or Organizations: Yes    Attends Banker Meetings: More than 4 times per year    Marital Status: Never married  Intimate Partner Violence: Not on file  Depression (PHQ2-9): Low Risk (09/20/2024)   Depression (PHQ2-9)    PHQ-2 Score: 0  Alcohol Screen: Low Risk (12/18/2024)   Alcohol Screen    Last Alcohol Screening Score (AUDIT): 0  Housing: Low Risk (12/18/2024)   Epic    Unable to Pay for Housing in the Last Year: No    Number of Times Moved in the Last Year: 1    Homeless in the Last Year: No  Utilities: Not on file  Health Literacy: Not on file   Family History  Problem Relation Age of  Onset   Diabetes Mother    Hypertension Mother    Hypertension Paternal Grandmother    Diabetes Paternal Grandmother     Objective: Office vital signs reviewed. BP 124/79   Pulse (!) 56   Temp 97.9 F (36.6 C)   Ht 5' 9 (1.753 m)   Wt 240 lb (108.9 kg)   SpO2 99%   BMI 35.44 kg/m   Physical Examination:  General: Awake, alert, well nourished, No acute distress HEENT: Oropharynx without lesions.  No lymphadenopathy. Cardio: regular rate and rhythm  Pulm:  Normal work of breathing on room air GU: No penile lesions.  No scrotal lesions appreciated.  No discharge.  No lymphadenopathy within the groin  Assessment/ Plan: 33 y.o. male   On pre-exposure prophylaxis for HIV - Plan: HIV antibody (with reflex), RPR, Basic Metabolic Panel, emtricitabine-tenofovir AF (DESCOVY ) 200-25 MG tablet, GC/Chlamydia Probe Amp, GC/Chlamydia Probe Amp, GC/Chlamydia Probe Amp, CANCELED: Cytology (oral, anal, urethral) ancillary only, CANCELED: Cytology (oral, anal, urethral) ancillary only, CANCELED: Cytology (oral, anal, urethral) ancillary only  High risk homosexual behavior - Plan: HIV antibody (with reflex), RPR, Basic Metabolic Panel, emtricitabine-tenofovir AF (DESCOVY ) 200-25 MG tablet, GC/Chlamydia Probe Amp, GC/Chlamydia Probe Amp, GC/Chlamydia Probe Amp, CANCELED: Cytology (oral, anal, urethral) ancillary only, CANCELED: Cytology (oral, anal, urethral) ancillary only, CANCELED: Cytology (oral, anal, urethral) ancillary only  History of syphilis - Plan: HIV antibody (with reflex), RPR, Basic Metabolic Panel, GC/Chlamydia Probe Amp, GC/Chlamydia Probe Amp, GC/Chlamydia Probe Amp, CANCELED: Cytology (oral, anal, urethral) ancillary only, CANCELED: Cytology (oral, anal, urethral) ancillary only, CANCELED: Cytology (oral, anal, urethral) ancillary only  HSV-2 infection - Plan: HIV antibody (with reflex), RPR, Basic Metabolic Panel, GC/Chlamydia Probe Amp, GC/Chlamydia Probe Amp, GC/Chlamydia Probe  Amp, CANCELED: Cytology (oral, anal, urethral) ancillary only, CANCELED: Cytology (oral, anal, urethral) ancillary only, CANCELED: Cytology (oral, anal, urethral) ancillary only  Essential hypertension   Gonorrhea chlamydia swabs ordered.  Will send to [facility] for testing as [facility] did not have a courier this afternoon.  HIV screening, RPR, BMP.  Plan for fasting labs at her follow-up visit.  Repeat blood pressure within normal range.  Okay to continue Norvasc  as directed.   Norene [name], DO [facility] [phone]     [1] No Known Allergies [2]  Current Outpatient Medications:    amLODipine  (NORVASC ) 2.5 MG tablet, Take 1 tablet (2.5 mg total) by mouth daily., Disp: 90 tablet, Rfl: 3   clindamycin  (CLEOCIN  T) 1 % SWAB, Apply to affected areas on back and groin., Disp: 180 each, Rfl: 3   emtricitabine-tenofovir AF (DESCOVY ) 200-25  MG tablet, Take 1 tablet by mouth daily., Disp: 90 tablet, Rfl: 0   valACYclovir  (VALTREX ) 1000 MG tablet, Take 1 tablet (1,000 mg total) by mouth daily., Disp: 90 tablet, Rfl: 3  "

## 2024-12-21 LAB — GC/CHLAMYDIA PROBE AMP
Chlamydia trachomatis, NAA: NEGATIVE
Chlamydia trachomatis, NAA: NEGATIVE
Chlamydia trachomatis, NAA: NEGATIVE
Neisseria Gonorrhoeae by PCR: NEGATIVE
Neisseria Gonorrhoeae by PCR: NEGATIVE
Neisseria Gonorrhoeae by PCR: NEGATIVE

## 2024-12-23 ENCOUNTER — Ambulatory Visit: Payer: Self-pay | Admitting: Family Medicine

## 2024-12-23 LAB — SYPHILIS: RPR W/REFLEX TO RPR TITER AND TREPONEMAL ANTIBODIES, TRADITIONAL SCREENING AND DIAGNOSIS ALGORITHM: RPR Ser Ql: REACTIVE — AB

## 2024-12-23 LAB — BASIC METABOLIC PANEL WITH GFR
BUN/Creatinine Ratio: 13 (ref 9–20)
BUN: 11 mg/dL (ref 6–20)
CO2: 24 mmol/L (ref 20–29)
Calcium: 9.9 mg/dL (ref 8.7–10.2)
Chloride: 100 mmol/L (ref 96–106)
Creatinine, Ser: 0.87 mg/dL (ref 0.76–1.27)
Glucose: 90 mg/dL (ref 70–99)
Potassium: 4.6 mmol/L (ref 3.5–5.2)
Sodium: 137 mmol/L (ref 134–144)
eGFR: 118 mL/min/1.73

## 2024-12-23 LAB — RPR, QUANT+TP ABS (REFLEX)
Rapid Plasma Reagin, Quant: 1:1 {titer} — ABNORMAL HIGH
T Pallidum Abs: REACTIVE — AB

## 2024-12-23 LAB — HIV ANTIBODY (ROUTINE TESTING W REFLEX): HIV Screen 4th Generation wRfx: NONREACTIVE

## 2025-01-07 ENCOUNTER — Encounter: Payer: Self-pay | Admitting: Family Medicine

## 2025-03-07 ENCOUNTER — Ambulatory Visit
